# Patient Record
Sex: Female | Born: 1935 | Race: White | Hispanic: No | Marital: Married | Smoking: Never smoker
Health system: Southern US, Community
[De-identification: ages and names within clinical notes are randomized; demographics above are authoritative.]

## PROBLEM LIST (undated history)

## (undated) DIAGNOSIS — M26609 Unspecified temporomandibular joint disorder, unspecified side: Secondary | ICD-10-CM

## (undated) DIAGNOSIS — T7840XA Allergy, unspecified, initial encounter: Secondary | ICD-10-CM

## (undated) DIAGNOSIS — C50919 Malignant neoplasm of unspecified site of unspecified female breast: Secondary | ICD-10-CM

## (undated) DIAGNOSIS — I639 Cerebral infarction, unspecified: Secondary | ICD-10-CM

## (undated) DIAGNOSIS — I6522 Occlusion and stenosis of left carotid artery: Secondary | ICD-10-CM

## (undated) HISTORY — DX: Unspecified temporomandibular joint disorder, unspecified side: M26.609

## (undated) HISTORY — DX: Allergy, unspecified, initial encounter: T78.40XA

## (undated) HISTORY — DX: Occlusion and stenosis of left carotid artery: I65.22

## (undated) HISTORY — PX: ABDOMINAL HYSTERECTOMY: SHX81

## (undated) HISTORY — DX: Malignant neoplasm of unspecified site of unspecified female breast: C50.919

## (undated) HISTORY — DX: Cerebral infarction, unspecified: I63.9

---

## 1998-07-15 DIAGNOSIS — C50919 Malignant neoplasm of unspecified site of unspecified female breast: Secondary | ICD-10-CM

## 1998-07-15 HISTORY — DX: Malignant neoplasm of unspecified site of unspecified female breast: C50.919

## 2008-11-28 ENCOUNTER — Emergency Department: Payer: Self-pay | Admitting: Emergency Medicine

## 2009-03-08 ENCOUNTER — Other Ambulatory Visit: Payer: Self-pay | Admitting: Internal Medicine

## 2009-03-28 ENCOUNTER — Ambulatory Visit: Payer: Self-pay | Admitting: Internal Medicine

## 2009-05-15 ENCOUNTER — Other Ambulatory Visit: Payer: Self-pay | Admitting: Internal Medicine

## 2009-09-06 ENCOUNTER — Ambulatory Visit: Payer: Self-pay | Admitting: Surgery

## 2009-09-11 ENCOUNTER — Ambulatory Visit: Payer: Self-pay | Admitting: Surgery

## 2010-04-26 ENCOUNTER — Ambulatory Visit: Payer: Self-pay | Admitting: Ophthalmology

## 2011-03-19 ENCOUNTER — Encounter: Payer: Self-pay | Admitting: Internal Medicine

## 2011-03-19 ENCOUNTER — Other Ambulatory Visit: Payer: Self-pay | Admitting: *Deleted

## 2011-03-19 ENCOUNTER — Ambulatory Visit (INDEPENDENT_AMBULATORY_CARE_PROVIDER_SITE_OTHER): Payer: Medicare Other | Admitting: Internal Medicine

## 2011-03-19 DIAGNOSIS — I10 Essential (primary) hypertension: Secondary | ICD-10-CM

## 2011-03-19 DIAGNOSIS — R300 Dysuria: Secondary | ICD-10-CM

## 2011-03-19 DIAGNOSIS — R5383 Other fatigue: Secondary | ICD-10-CM

## 2011-03-19 DIAGNOSIS — Z1231 Encounter for screening mammogram for malignant neoplasm of breast: Secondary | ICD-10-CM

## 2011-03-19 DIAGNOSIS — Z0000 Encounter for general adult medical examination without abnormal findings: Secondary | ICD-10-CM

## 2011-03-19 DIAGNOSIS — R5381 Other malaise: Secondary | ICD-10-CM

## 2011-03-19 LAB — POCT URINALYSIS DIPSTICK
Bilirubin, UA: NEGATIVE
Glucose, UA: NEGATIVE
Ketones, UA: NEGATIVE
Protein, UA: 30
Spec Grav, UA: 1.015

## 2011-03-19 LAB — COMPREHENSIVE METABOLIC PANEL
AST: 26 U/L (ref 0–37)
Alkaline Phosphatase: 103 U/L (ref 39–117)
BUN: 13 mg/dL (ref 6–23)
Calcium: 9.3 mg/dL (ref 8.4–10.5)
Chloride: 105 mEq/L (ref 96–112)
Creatinine, Ser: 0.6 mg/dL (ref 0.4–1.2)
Glucose, Bld: 103 mg/dL — ABNORMAL HIGH (ref 70–99)

## 2011-03-19 LAB — CBC WITH DIFFERENTIAL/PLATELET
Basophils Relative: 0.7 % (ref 0.0–3.0)
Eosinophils Absolute: 0.1 10*3/uL (ref 0.0–0.7)
Eosinophils Relative: 1.6 % (ref 0.0–5.0)
Hemoglobin: 11.2 g/dL — ABNORMAL LOW (ref 12.0–15.0)
Lymphocytes Relative: 23.6 % (ref 12.0–46.0)
MCHC: 33 g/dL (ref 30.0–36.0)
Neutro Abs: 5.8 10*3/uL (ref 1.4–7.7)
Neutrophils Relative %: 65.8 % (ref 43.0–77.0)
RBC: 3.66 Mil/uL — ABNORMAL LOW (ref 3.87–5.11)
WBC: 8.8 10*3/uL (ref 4.5–10.5)

## 2011-03-19 LAB — FERRITIN: Ferritin: 55.2 ng/mL (ref 10.0–291.0)

## 2011-03-19 MED ORDER — SULFAMETHOXAZOLE-TMP DS 800-160 MG PO TABS: 1.0000 | ORAL_TABLET | Freq: Two times a day (BID) | ORAL | Status: AC

## 2011-03-19 MED ORDER — ZOSTER VACCINE LIVE 19400 UNT/0.65ML ~~LOC~~ SOLR
0.6500 mL | Freq: Once | SUBCUTANEOUS | Status: DC
Start: 2011-03-19 — End: 2011-06-12

## 2011-03-19 NOTE — Progress Notes (Signed)
Subjective:    Patient ID: Tracy [name], female    DOB: [birth year], 76 y.o.   MRN: [id number]  HPI  Tracy [name] is a 76 year old female who presents for followup. She reports recent fatigue. She denies any dyspnea, fever, weight loss, or other focal symptoms. She is concerned that she may have hypothyroidism. She also notes dysuria over the last one to 2 weeks. She reports urinary urgency, and frequency, however denies any hematuria, flank pain, fever.  Tracy [name] has also had a history of mildly elevated blood pressure. In the past has been well controlled with diet and exercise. She reports at home her typical numbers are 130s over 80s. She has not had any chest pain, palpitations, headache, or shortness of breath.     Outpatient Encounter Prescriptions as of 03/19/2011  Medication Sig Dispense Refill  . Calcium Carbonate (CALCIUM 500 PO) Take 1 capsule by mouth daily.        . Cholecalciferol (VITAMIN D3) 1000 UNITS CAPS Take 1 tablet by mouth daily.        . Coenzyme Q10 (COQ10) 100 MG CAPS Take 1 tablet by mouth daily.        . diazepam (VALIUM) 2 MG tablet Take 2 mg by mouth as needed.        . fish oil-omega-3 fatty acids 1000 MG capsule Take 2 g by mouth daily.        Marland Kitchen ibuprofen (ADVIL,MOTRIN) 200 MG tablet Take 800 mg by mouth every 6 (six) hours as needed.        . Lutein 20 MG CAPS Take 1 capsule by mouth daily.        . Magnesium 500 MG CAPS Take 1 capsule by mouth daily.        . niacin (SLO-NIACIN) 500 MG tablet Take 500 mg by mouth daily.        . Zinc 50 MG CAPS Take 1 capsule by mouth daily.        Marland Kitchen zoster vaccine live, PF, (ZOSTAVAX) 78295 UNT/0.65ML injection Inject 19,400 Units into the skin once.  1 vial  0    Review of Systems  Constitutional: Positive for fatigue. Negative for fever, chills, appetite change and unexpected weight change.  HENT: Negative for ear pain, congestion, sore throat, trouble swallowing, neck pain, voice change and sinus pressure.     Eyes: Negative for visual disturbance.  Respiratory: Negative for cough, shortness of breath, wheezing and stridor.   Cardiovascular: Positive for leg swelling. Negative for chest pain and palpitations.  Gastrointestinal: Negative for nausea, vomiting, abdominal pain, diarrhea, constipation, blood in stool, abdominal distention and anal bleeding.  Genitourinary: Positive for dysuria, urgency and frequency. Negative for hematuria and flank pain.  Musculoskeletal: Negative for myalgias, arthralgias and gait problem.  Skin: Negative for color change and rash.  Neurological: Negative for dizziness and headaches.  Hematological: Negative for adenopathy. Does not bruise/bleed easily.  Psychiatric/Behavioral: Negative for suicidal ideas, sleep disturbance and dysphoric mood. The patient is not nervous/anxious.    BP 157/82  Pulse 91  Temp(Src) 98.1 F (36.7 C) (Oral)  Resp 14  Ht 5\' 3"  (1.6 m)  Wt 153 lb 8 oz (69.627 kg)  BMI 27.19 kg/m2  SpO2 99%     Objective:   Physical Exam  Constitutional: She is oriented to person, place, and time. She appears well-developed and well-nourished. No distress.  HENT:  Head: Normocephalic and atraumatic.  Right Ear: External ear normal.  Left Ear: External ear normal.  Nose: Nose normal.  Mouth/Throat: Oropharynx is clear and moist. No oropharyngeal exudate.  Eyes: Conjunctivae are normal. Pupils are equal, round, and reactive to light. Right eye exhibits no discharge. Left eye exhibits no discharge. No scleral icterus.  Neck: Normal range of motion. Neck supple. No tracheal deviation present. No thyromegaly present.  Cardiovascular: Normal rate, regular rhythm, normal heart sounds and intact distal pulses.  Exam reveals no gallop and no friction rub.   No murmur heard. Pulmonary/Chest: Effort normal and breath sounds normal. No respiratory distress. She has no wheezes. She has no rales. She exhibits no tenderness.  Abdominal: Soft. Bowel sounds are  normal. She exhibits no distension and no mass. There is no tenderness. There is no rebound and no guarding.  Genitourinary: No breast swelling, tenderness, discharge or bleeding.  Musculoskeletal: Normal range of motion. She exhibits no edema and no tenderness.  Lymphadenopathy:    She has no cervical adenopathy.  Neurological: She is alert and oriented to person, place, and time. No cranial nerve deficit. She exhibits normal muscle tone. Coordination normal.  Skin: Skin is warm and dry. No rash noted. She is not diaphoretic. No erythema. No pallor.  Psychiatric: She has a normal mood and affect. Her behavior is normal. Judgment and thought content normal.          Assessment & Plan:  1. Fatigue - question if fatigue may be related to urinary checked infection. Will treat urinary tract infection to see if any improvement. Will check CBC, TSH, CMP with lab work today. She will return to clinic in one month.  2. Dysuria - urine dip today positive for leukocytes. Will treat with Bactrim double strength tablet 1 by mouth twice daily x7 days. Urine culture will be sent. She will call or return to clinic immediately if symptoms do not improve or if they worsen.  3. Hypertension - patient with elevated blood pressure which has been previously controlled with diet and exercise alone. Blood pressure noted to be elevated today. She will return to clinic in one month. In the interim until her next visit she will record her blood pressure one to 2 times per week. She will call if blood pressure consistently running greater than 140/90.  4. Health maintenance - patient is due for her yearly breast exam today. This was performed and was unremarkable. Screening mammogram will be set up. She is status post hysterectomy and does not need further Pap smears. Lab work is up to date. We discussed Zostavax today and prescription was called to her pharmacy.

## 2011-03-19 NOTE — Patient Instructions (Signed)
Record blood pressure twice per week. Return to clinic in 1 month.  Hypertension (High Blood Pressure) As your heart beats, it forces blood through your arteries. This force is your blood pressure. If the pressure is too high, it is called hypertension (HTN) or high blood pressure. HTN is dangerous because you may have it and not know it. High blood pressure may mean that your heart has to work harder to pump blood. Your arteries may be narrow or stiff. The extra work puts you at risk for heart disease, stroke, and other problems.  Blood pressure consists of two numbers, a higher number over a lower, 110/72, for example. It is stated as "110 over 72." The ideal is below 120 for the top number (systolic) and under 80 for the bottom (diastolic). Write down your blood pressure today. You should pay close attention to your blood pressure if you have certain conditions such as:  Heart failure.  Prior heart attack.   Diabetes   Chronic kidney disease.   Prior stroke.   Multiple risk factors for heart disease.   To see if you have HTN, your blood pressure should be measured while you are seated with your arm held at the level of the heart. It should be measured at least twice. A one-time elevated blood pressure reading (especially in the Emergency Department) does not mean that you need treatment. There may be conditions in which the blood pressure is different between your right and left arms. It is important to see your caregiver soon for a recheck. Most people have essential hypertension which means that there is not a specific cause. This type of high blood pressure may be lowered by changing lifestyle factors such as:  Stress.  Smoking.   Lack of exercise.   Excessive weight.  Drug/tobacco/alcohol use.   Eating less salt.   Most people do not have symptoms from high blood pressure until it has caused damage to the body. Effective treatment can often prevent, delay or reduce that  damage. TREATMENT Treatment for high blood pressure, when a cause has been identified, is directed at the cause. There are a large number of medications to treat HTN. These fall into several categories, and your caregiver will help you select the medicines that are best for you. Medications may have side effects. You should review side effects with your caregiver. If your blood pressure stays high after you have made lifestyle changes or started on medicines,   Your medication(s) may need to be changed.   Other problems may need to be addressed.   Be certain you understand your prescriptions, and know how and when to take your medicine.   Be sure to follow up with your caregiver within the time frame advised (usually within two weeks) to have your blood pressure rechecked and to review your medications.   If you are taking more than one medicine to lower your blood pressure, make sure you know how and at what times they should be taken. Taking two medicines at the same time can result in blood pressure that is too low.  SEEK IMMEDIATE MEDICAL CARE IF YOU DEVELOP:  A severe headache, blurred or changing vision, or confusion.   Unusual weakness or numbness, or a faint feeling.   Severe chest or abdominal pain, vomiting, or breathing problems.  MAKE SURE YOU:   Understand these instructions.   Will watch your condition.   Will get help right away if you are not doing well or get  worse.  Document Released: 07/01/2005 Document Re-Released: 12/19/2009 [facility] Patient Information 2011 [place], [place].

## 2011-03-21 LAB — URINE CULTURE
Colony Count: NO GROWTH
Organism ID, Bacteria: NO GROWTH

## 2011-03-25 ENCOUNTER — Other Ambulatory Visit: Payer: Self-pay | Admitting: Internal Medicine

## 2011-03-25 ENCOUNTER — Telehealth: Payer: Self-pay | Admitting: *Deleted

## 2011-03-25 DIAGNOSIS — L989 Disorder of the skin and subcutaneous tissue, unspecified: Secondary | ICD-10-CM

## 2011-03-25 NOTE — Telephone Encounter (Signed)
Patient is asking if you could refer her to a dermatologist for some places on her skin that she is concerned with. She only wants to see a female. Please advise.

## 2011-03-25 NOTE — Telephone Encounter (Signed)
Sure, I would recommend Dr. Willeen Niece or Lowella Bandy at [facility].

## 2011-03-25 NOTE — Telephone Encounter (Signed)
Patient notified. She will call to schedule her self an appt.

## 2011-03-27 ENCOUNTER — Other Ambulatory Visit: Payer: Self-pay | Admitting: Internal Medicine

## 2011-03-27 DIAGNOSIS — Z1239 Encounter for other screening for malignant neoplasm of breast: Secondary | ICD-10-CM

## 2011-03-27 DIAGNOSIS — C50919 Malignant neoplasm of unspecified site of unspecified female breast: Secondary | ICD-10-CM

## 2011-04-03 ENCOUNTER — Encounter: Payer: Self-pay | Admitting: *Deleted

## 2011-04-04 ENCOUNTER — Other Ambulatory Visit: Payer: Medicare Other

## 2011-04-04 ENCOUNTER — Other Ambulatory Visit: Payer: Self-pay

## 2011-04-04 DIAGNOSIS — R5381 Other malaise: Secondary | ICD-10-CM

## 2011-04-04 DIAGNOSIS — R5383 Other fatigue: Secondary | ICD-10-CM

## 2011-04-09 ENCOUNTER — Telehealth: Payer: Self-pay | Admitting: *Deleted

## 2011-04-09 NOTE — Telephone Encounter (Signed)
Message copied by Jobie Quaker on Tue Apr 09, 2011  9:26 AM ------      Message from: Ronna Polio A      Created: Tue Apr 09, 2011  7:59 AM      Regarding: Mammo       Mammogram was normal.

## 2011-04-09 NOTE — Telephone Encounter (Signed)
Patient notified

## 2011-04-10 ENCOUNTER — Telehealth: Payer: Self-pay | Admitting: Internal Medicine

## 2011-04-10 NOTE — Telephone Encounter (Signed)
Pt called to see what appointment you were calling her about

## 2011-04-12 NOTE — Telephone Encounter (Signed)
[facility] and patient has an appt. With Dr. Roseanne Reno on 04/22/11 at 11:45. Patient is aware of appointment.

## 2011-04-18 ENCOUNTER — Ambulatory Visit (INDEPENDENT_AMBULATORY_CARE_PROVIDER_SITE_OTHER): Payer: Medicare Other | Admitting: Internal Medicine

## 2011-04-18 ENCOUNTER — Encounter: Payer: Self-pay | Admitting: Internal Medicine

## 2011-04-18 DIAGNOSIS — J309 Allergic rhinitis, unspecified: Secondary | ICD-10-CM | POA: Insufficient documentation

## 2011-04-18 DIAGNOSIS — R030 Elevated blood-pressure reading, without diagnosis of hypertension: Secondary | ICD-10-CM

## 2011-04-18 NOTE — Progress Notes (Signed)
Subjective:    Patient ID: Tracy [name], female    DOB: 26-Jun-1935, 76 y.o.   MRN: [id number]  HPI 75YO female who presents to follow up elevated BP noted at last visit. She brings record of BP which have typically been between 100-130/60-70.  She denies any chest pain, diaphoresis, palpitations or other complaints.  She is concerned today about recent increased clear nasal drainage and some tenderness especially in her right nare.  She denies fever, chills, sinus pressure. She is not currently taking any allergy medicines.  Outpatient Encounter Prescriptions as of 04/18/2011  Medication Sig Dispense Refill  . Calcium Carbonate (CALCIUM 500 PO) Take 1 capsule by mouth daily.        . Cholecalciferol (VITAMIN D3) 1000 UNITS CAPS Take 1 tablet by mouth daily.        . Coenzyme Q10 (COQ10) 100 MG CAPS Take 1 tablet by mouth daily.        . diazepam (VALIUM) 2 MG tablet Take 2 mg by mouth as needed.        . fish oil-omega-3 fatty acids 1000 MG capsule Take 2 g by mouth daily.        Marland Kitchen ibuprofen (ADVIL,MOTRIN) 200 MG tablet Take 800 mg by mouth every 6 (six) hours as needed.        . Lutein 20 MG CAPS Take 1 capsule by mouth daily.        . Magnesium 500 MG CAPS Take 1 capsule by mouth daily.        . niacin (SLO-NIACIN) 500 MG tablet Take 500 mg by mouth daily.        . Zinc 50 MG CAPS Take 1 capsule by mouth daily.        Marland Kitchen zoster vaccine live, PF, (ZOSTAVAX) 40981 UNT/0.65ML injection Inject 19,400 Units into the skin once.  1 vial  0    Review of Systems  Constitutional: Negative for fever, chills, diaphoresis, appetite change, fatigue and unexpected weight change.  HENT: Positive for nosebleeds, congestion, rhinorrhea and postnasal drip. Negative for ear pain, sore throat, trouble swallowing, neck pain, voice change and sinus pressure.   Eyes: Negative for visual disturbance.  Respiratory: Negative for cough, shortness of breath, wheezing and stridor.   Cardiovascular: Negative for  chest pain, palpitations and leg swelling.  Gastrointestinal: Negative for nausea, vomiting, abdominal pain, diarrhea, constipation, blood in stool, abdominal distention and anal bleeding.  Genitourinary: Negative for dysuria and flank pain.  Musculoskeletal: Negative for myalgias, arthralgias and gait problem.  Skin: Negative for color change and rash.  Neurological: Negative for dizziness and headaches.  Hematological: Negative for adenopathy. Does not bruise/bleed easily.  Psychiatric/Behavioral: Negative for suicidal ideas, sleep disturbance and dysphoric mood. The patient is not nervous/anxious.    BP 102/78  Pulse 100  Temp 98.6 F (37 C)  Resp 16  Ht 5\' 3"  (1.6 m)  Wt 142 lb (64.411 kg)  BMI 25.15 kg/m2  SpO2 99%     Objective:   Physical Exam  Constitutional: She is oriented to person, place, and time. She appears well-developed and well-nourished. No distress.  HENT:  Head: Normocephalic and atraumatic.  Right Ear: External ear normal.  Left Ear: External ear normal.  Nose: Mucosal edema and rhinorrhea present. Right sinus exhibits no maxillary sinus tenderness and no frontal sinus tenderness. Left sinus exhibits no maxillary sinus tenderness and no frontal sinus tenderness.  Mouth/Throat: Oropharynx is clear and moist. No oropharyngeal exudate.  Eyes: Conjunctivae are normal. Pupils  are equal, round, and reactive to light. Right eye exhibits no discharge. Left eye exhibits no discharge. No scleral icterus.  Neck: Normal range of motion. Neck supple. No tracheal deviation present. No thyromegaly present.  Cardiovascular: Normal rate, regular rhythm, normal heart sounds and intact distal pulses.  Exam reveals no gallop and no friction rub.   No murmur heard. Pulmonary/Chest: Effort normal and breath sounds normal. No respiratory distress. She has no wheezes. She has no rales. She exhibits no tenderness.  Musculoskeletal: Normal range of motion. She exhibits no edema and no  tenderness.  Lymphadenopathy:    She has no cervical adenopathy.  Neurological: She is alert and oriented to person, place, and time. No cranial nerve deficit. She exhibits normal muscle tone. Coordination normal.  Skin: Skin is warm and dry. No rash noted. She is not diaphoretic. No erythema. No pallor.  Psychiatric: She has a normal mood and affect. Her behavior is normal. Judgment and thought content normal.          Assessment & Plan:  1. Elevated blood pressure without diagnosis of HTN - BP much improved today. Will continue to monitor. Pt will call if >140/90  2. Allergic rhinitis - Will start nasal steroid to see if any improvement. Will also use OTC Claritin or Zyrtec daily during Fall allergy season. Pt will follow up in 6 months or earlier as needed.

## 2011-04-18 NOTE — Patient Instructions (Signed)
Start nasal steroid. Start Claritin. Call if any problems.

## 2011-04-23 ENCOUNTER — Encounter: Payer: Self-pay | Admitting: Internal Medicine

## 2011-05-06 ENCOUNTER — Telehealth: Payer: Self-pay | Admitting: *Deleted

## 2011-05-06 ENCOUNTER — Other Ambulatory Visit (INDEPENDENT_AMBULATORY_CARE_PROVIDER_SITE_OTHER): Payer: Medicare Other | Admitting: *Deleted

## 2011-05-06 DIAGNOSIS — N390 Urinary tract infection, site not specified: Secondary | ICD-10-CM

## 2011-05-06 LAB — POCT URINALYSIS DIPSTICK
Glucose, UA: NEGATIVE
Ketones, UA: 80
Nitrite, UA: NEGATIVE

## 2011-05-06 NOTE — Telephone Encounter (Signed)
Patient came in to give urine specimen. UA results entered and urine sent for culture.

## 2011-05-09 LAB — URINE CULTURE

## 2011-05-13 ENCOUNTER — Other Ambulatory Visit: Payer: Self-pay | Admitting: *Deleted

## 2011-05-13 ENCOUNTER — Other Ambulatory Visit (INDEPENDENT_AMBULATORY_CARE_PROVIDER_SITE_OTHER): Payer: Medicare Other | Admitting: *Deleted

## 2011-05-13 DIAGNOSIS — N390 Urinary tract infection, site not specified: Secondary | ICD-10-CM

## 2011-05-13 LAB — POCT URINALYSIS DIPSTICK
Nitrite, UA: NEGATIVE
Protein, UA: 30
pH, UA: 6

## 2011-05-13 MED ORDER — CIPROFLOXACIN HCL 500 MG PO TABS: 500.0000 mg | ORAL_TABLET | Freq: Two times a day (BID) | ORAL | Status: DC

## 2011-05-20 ENCOUNTER — Telehealth: Payer: Self-pay | Admitting: *Deleted

## 2011-05-20 ENCOUNTER — Other Ambulatory Visit (INDEPENDENT_AMBULATORY_CARE_PROVIDER_SITE_OTHER): Payer: Medicare Other | Admitting: *Deleted

## 2011-05-20 DIAGNOSIS — N390 Urinary tract infection, site not specified: Secondary | ICD-10-CM

## 2011-05-20 LAB — POCT URINALYSIS DIPSTICK
Bilirubin, UA: NEGATIVE
Protein, UA: NEGATIVE
Spec Grav, UA: 1.015
pH, UA: 6.5

## 2011-05-20 MED ORDER — CIPROFLOXACIN HCL 250 MG PO TABS: 250.0000 mg | ORAL_TABLET | Freq: Two times a day (BID) | ORAL | Status: AC

## 2011-05-20 NOTE — Telephone Encounter (Signed)
Results ready, please advise.

## 2011-05-20 NOTE — Telephone Encounter (Signed)
Message copied by Vernie Murders on Mon May 20, 2011  2:17 PM ------      Message from: Ronna Polio A      Created: Mon May 20, 2011 11:41 AM       Please call in Cipro 250mg  by mouth twice daily x 7 days. Please send urine for culture.

## 2011-05-20 NOTE — Telephone Encounter (Signed)
Patient informed. 

## 2011-05-20 NOTE — Telephone Encounter (Signed)
Pt scheduled for lab visit today. She continues to c/o cloudy urine and worried that she still has uti. Scheduled for u/a and culture today.

## 2011-05-22 LAB — URINE CULTURE: Colony Count: NO GROWTH

## 2011-05-27 ENCOUNTER — Telehealth: Payer: Self-pay | Admitting: *Deleted

## 2011-05-27 NOTE — Telephone Encounter (Signed)
Agree with plan 

## 2011-05-27 NOTE — Telephone Encounter (Signed)
Spoke w/pt - she c/o continued cloudy urine and fatigue. I explained that she had no growth on culture. Pt completed cipro today. I advised her that abx would stay in her system for a while but I set her up for recheck lab Wed. She is to call with any urinary burning, frequency, LBP, fever etc... Pt agreed.

## 2011-05-29 ENCOUNTER — Other Ambulatory Visit: Payer: Self-pay | Admitting: *Deleted

## 2011-05-29 ENCOUNTER — Other Ambulatory Visit (INDEPENDENT_AMBULATORY_CARE_PROVIDER_SITE_OTHER): Payer: Medicare Other | Admitting: *Deleted

## 2011-05-29 DIAGNOSIS — N390 Urinary tract infection, site not specified: Secondary | ICD-10-CM

## 2011-05-29 LAB — POCT URINALYSIS DIPSTICK
Glucose, UA: NEGATIVE
Protein, UA: NEGATIVE
Spec Grav, UA: 1.01
Urobilinogen, UA: 0.2

## 2011-05-29 MED ORDER — SULFAMETHOXAZOLE-TRIMETHOPRIM 800-160 MG PO TABS: 1.0000 | ORAL_TABLET | Freq: Two times a day (BID) | ORAL | Status: AC

## 2011-05-31 LAB — URINE CULTURE

## 2011-06-12 ENCOUNTER — Encounter: Payer: Self-pay | Admitting: Internal Medicine

## 2011-06-12 ENCOUNTER — Ambulatory Visit (INDEPENDENT_AMBULATORY_CARE_PROVIDER_SITE_OTHER): Payer: Medicare Other | Admitting: Internal Medicine

## 2011-06-12 VITALS — BP 160/80 | HR 77 | Temp 98.1°F | Wt 137.0 lb

## 2011-06-12 DIAGNOSIS — R300 Dysuria: Secondary | ICD-10-CM

## 2011-06-12 DIAGNOSIS — H9209 Otalgia, unspecified ear: Secondary | ICD-10-CM

## 2011-06-12 DIAGNOSIS — Z0000 Encounter for general adult medical examination without abnormal findings: Secondary | ICD-10-CM

## 2011-06-12 LAB — POCT URINALYSIS DIPSTICK
Bilirubin, UA: NEGATIVE
Ketones, UA: NEGATIVE
Nitrite, UA: NEGATIVE
Protein, UA: NEGATIVE
pH, UA: 6.5

## 2011-06-12 MED ORDER — ZOSTER VACCINE LIVE 19400 UNT/0.65ML ~~LOC~~ SOLR: 0.6500 mL | Freq: Once | SUBCUTANEOUS | Status: DC

## 2011-06-12 MED ORDER — ESTROGENS, CONJUGATED 0.625 MG/GM VA CREA: TOPICAL_CREAM | VAGINAL | Status: DC

## 2011-06-12 MED ORDER — CIPROFLOXACIN HCL 500 MG PO TABS: 500.0000 mg | ORAL_TABLET | Freq: Two times a day (BID) | ORAL | Status: AC

## 2011-06-12 NOTE — Progress Notes (Signed)
Subjective:    Patient ID: Tracy [name], female    DOB: Jan 09, 1935, 76 y.o.   MRN: [id number]  HPI 76YO female presents for acute visit. Two primary concerns today. First, notes that urine is cloudy and rust colored. Had UA 11/14 which showed positive leukocytes and blood. Urine culture at that time was negative. She was treated empirically with Bactrim. She reports that her symptoms did not improve. She continues to have cloudy urine and some dysuria. She denies flank pain. She denies fever or chills. She has been increasing her fluid intake with no improvement in her symptoms.  She is also concerned today about bilateral ear pain. She notes a sense of pressure in both of her ears. She notes that her hearing is slightly decreased. She has had this problem in the past and he responded to Claritin. She also notes that she has followup with audiology next week. She denies fever or chills. She denies nasal congestion.  Outpatient Encounter Prescriptions as of 06/12/2011  Medication Sig Dispense Refill  . Calcium Carbonate (CALCIUM 500 PO) Take 1 capsule by mouth daily.        . Cholecalciferol (VITAMIN D3) 1000 UNITS CAPS Take 1 tablet by mouth daily.        . Coenzyme Q10 (COQ10) 100 MG CAPS Take 1 tablet by mouth daily.        . diazepam (VALIUM) 2 MG tablet Take 2 mg by mouth as needed.        . fish oil-omega-3 fatty acids 1000 MG capsule Take 2 g by mouth daily.        Marland Kitchen ibuprofen (ADVIL,MOTRIN) 200 MG tablet Take 800 mg by mouth every 6 (six) hours as needed.        . Lutein 20 MG CAPS Take 1 capsule by mouth daily.        . Magnesium 500 MG CAPS Take 1 capsule by mouth daily.        . niacin (SLO-NIACIN) 500 MG tablet Take 500 mg by mouth daily.        . Zinc 50 MG CAPS Take 1 capsule by mouth daily.        Marland Kitchen zoster vaccine live, PF, (ZOSTAVAX) 91478 UNT/0.65ML injection Inject 19,400 Units into the skin once.  1 vial  0    Review of Systems  Constitutional: Negative for fever,  chills, appetite change, fatigue and unexpected weight change.  HENT: Positive for hearing loss and ear pain. Negative for congestion, sore throat, trouble swallowing, neck pain, voice change, sinus pressure and ear discharge.   Eyes: Negative for visual disturbance.  Respiratory: Negative for cough, shortness of breath, wheezing and stridor.   Cardiovascular: Negative for chest pain, palpitations and leg swelling.  Gastrointestinal: Negative for nausea, vomiting, abdominal pain, diarrhea, constipation and abdominal distention.  Genitourinary: Positive for dysuria, hematuria and pelvic pain. Negative for flank pain.  Musculoskeletal: Negative for myalgias, arthralgias and gait problem.  Skin: Negative for color change and rash.  Neurological: Negative for dizziness and headaches.  Hematological: Negative for adenopathy. Does not bruise/bleed easily.  Psychiatric/Behavioral: Negative for suicidal ideas, sleep disturbance and dysphoric mood. The patient is not nervous/anxious.    BP 160/80  Pulse 77  Temp(Src) 98.1 F (36.7 C) (Oral)  Wt 137 lb (62.143 kg)  SpO2 99%     Objective:   Physical Exam  Constitutional: She is oriented to person, place, and time. She appears well-developed and well-nourished. No distress.  HENT:  Head: Normocephalic and  atraumatic.  Right Ear: External ear normal. Tympanic membrane is not injected and not erythematous. A middle ear effusion is present.  Left Ear: External ear normal. Tympanic membrane is not injected and not erythematous. A middle ear effusion is present.  Nose: Nose normal.  Mouth/Throat: Oropharynx is clear and moist. No oropharyngeal exudate.  Eyes: Conjunctivae are normal. Pupils are equal, round, and reactive to light. Right eye exhibits no discharge. Left eye exhibits no discharge. No scleral icterus.  Neck: Normal range of motion. Neck supple. No tracheal deviation present. No thyromegaly present.  Cardiovascular: Normal rate, regular  rhythm, normal heart sounds and intact distal pulses.  Exam reveals no gallop and no friction rub.   No murmur heard. Pulmonary/Chest: Effort normal and breath sounds normal. No respiratory distress. She has no wheezes. She has no rales. She exhibits no tenderness.  Abdominal: Soft. She exhibits no distension. There is tenderness (suprapubic). There is no CVA tenderness.  Musculoskeletal: Normal range of motion. She exhibits no edema and no tenderness.  Lymphadenopathy:    She has no cervical adenopathy.  Neurological: She is alert and oriented to person, place, and time. No cranial nerve deficit. She exhibits normal muscle tone. Coordination normal.  Skin: Skin is warm and dry. No rash noted. She is not diaphoretic. No erythema. No pallor.  Psychiatric: She has a normal mood and affect. Her behavior is normal. Judgment and thought content normal.          Assessment & Plan:  1. Dysuria - Last urine culture neg and symptoms not alleviated after Bactrim or Cipro. Repeat UA today shows pos blood and leuk. Will send urine for culture. Will start Cipro bid x 14 days. Will start topical estrogen cream to help with atrophic vaginitis to see if this helps decrease recurrence of UTIs. Follow up 2 weeks.  2. Ear pain - Pt with bilateral middle ear effusions.  Will again try using Claritin-D.  We discussed possible use of prednisone. No signs of infection.  She has follow up with audiology-ENT next week. May eventually need tympanostomy tubes if symptoms persistent.  3. Health maintenance - Rx for zostavax submitted today.

## 2011-06-12 NOTE — Patient Instructions (Signed)
Your urine appears to be infected. We will send urine for culture.    Start Premarin cream. Apply pea-size amount to external genitalia daily.  Start Cipro twice daily.  Follow up 2 weeks.

## 2011-06-13 ENCOUNTER — Telehealth: Payer: Self-pay | Admitting: *Deleted

## 2011-06-13 NOTE — Telephone Encounter (Signed)
It is a topical cream, so very little absorbed. If she is reluctant to use it, however, we can hold off and just try treating UTI for 2 weeks with Cipro to see if resolution.

## 2011-06-13 NOTE — Telephone Encounter (Signed)
Patient informed, She will hold off on cream and keep f/u apt

## 2011-06-13 NOTE — Telephone Encounter (Signed)
Pt left VM - she was given RX for vaginal cream and realized after picking up RX that it was a hormone cream. She is worried about talking hormones due to history of cancer. Please advise.

## 2011-06-14 LAB — URINE CULTURE: Colony Count: NO GROWTH

## 2011-07-01 ENCOUNTER — Ambulatory Visit (INDEPENDENT_AMBULATORY_CARE_PROVIDER_SITE_OTHER): Payer: Medicare Other | Admitting: Internal Medicine

## 2011-07-01 ENCOUNTER — Encounter: Payer: Self-pay | Admitting: Internal Medicine

## 2011-07-01 VITALS — BP 160/80 | HR 89 | Temp 97.7°F | Wt 136.0 lb

## 2011-07-01 DIAGNOSIS — K4090 Unilateral inguinal hernia, without obstruction or gangrene, not specified as recurrent: Secondary | ICD-10-CM

## 2011-07-01 DIAGNOSIS — R319 Hematuria, unspecified: Secondary | ICD-10-CM

## 2011-07-01 DIAGNOSIS — N390 Urinary tract infection, site not specified: Secondary | ICD-10-CM

## 2011-07-01 DIAGNOSIS — M549 Dorsalgia, unspecified: Secondary | ICD-10-CM

## 2011-07-01 LAB — COMPREHENSIVE METABOLIC PANEL
ALT: 14 U/L (ref 0–35)
AST: 22 U/L (ref 0–37)
Albumin: 3.5 g/dL (ref 3.5–5.2)
Alkaline Phosphatase: 82 U/L (ref 39–117)
Potassium: 4 mEq/L (ref 3.5–5.1)
Sodium: 140 mEq/L (ref 135–145)
Total Bilirubin: 0.4 mg/dL (ref 0.3–1.2)
Total Protein: 8.2 g/dL (ref 6.0–8.3)

## 2011-07-01 LAB — POCT URINALYSIS DIPSTICK
Ketones, UA: NEGATIVE
Protein, UA: NEGATIVE
Spec Grav, UA: 1.015
Urobilinogen, UA: 0.2
pH, UA: 7

## 2011-07-01 MED ORDER — MELOXICAM 15 MG PO TABS: 15.0000 mg | ORAL_TABLET | Freq: Every day | ORAL | Status: DC

## 2011-07-01 MED ORDER — CYCLOBENZAPRINE HCL 10 MG PO TABS: 10.0000 mg | ORAL_TABLET | Freq: Three times a day (TID) | ORAL | Status: AC | PRN

## 2011-07-01 MED ORDER — CIPROFLOXACIN HCL 500 MG PO TABS: 500.0000 mg | ORAL_TABLET | Freq: Two times a day (BID) | ORAL | Status: AC

## 2011-07-01 NOTE — Progress Notes (Signed)
Subjective:    Patient ID: Tracy [name], female    DOB: 05/20/1935, 76 y.o.   MRN: [id number]  HPI 76YO female with h/o recurrent UTI presents for follow up. Did not start Premarin cream because of concern about estrogen exposure and malignancy. Notes seeing some hematuria on one occasion. Denies flank pain, fever, chills, dysuria, frequency, urgency.  Notes that she recently traveled to [place] and had some increased pain in her hip and left upper back after this trip. She attributed this to arthritis pain. No specific injury noted. Took husband's meloxicam and hydrocodone with some improvement.  Pain is persistent in left upper back. Located from base of neck to left upper shoulder blade.  Denies weakness in her left arm or pain with movement of her arm. Reports symptoms are gradually improving.  Also concerned today about bulging area right lower abdomen/groin. Noticed this about 2-3 weeks ago. Notes it is more prominent when she is standing. The area is not painful. She denies any overlying skin changes. Denies fever, chills, diarrhea, constipation, nausea.  Outpatient Encounter Prescriptions as of 07/01/2011  Medication Sig Dispense Refill  . Calcium Carbonate (CALCIUM 500 PO) Take 1 capsule by mouth daily.        . Cholecalciferol (VITAMIN D3) 1000 UNITS CAPS Take 1 tablet by mouth daily.        . Coenzyme Q10 (COQ10) 100 MG CAPS Take 1 tablet by mouth daily.        Marland Kitchen conjugated estrogens (PREMARIN) vaginal cream Apply pea-size amount to external genitalia daily  42.5 g  12  . diazepam (VALIUM) 2 MG tablet Take 2 mg by mouth as needed.        . fish oil-omega-3 fatty acids 1000 MG capsule Take 2 g by mouth daily.        . Lutein 20 MG CAPS Take 1 capsule by mouth daily.        . Magnesium 500 MG CAPS Take 1 capsule by mouth daily.        . niacin (SLO-NIACIN) 500 MG tablet Take 500 mg by mouth daily.        . Zinc 50 MG CAPS Take 1 capsule by mouth daily.        Marland Kitchen DISCONTD:  zoster vaccine live, PF, (ZOSTAVAX) 11914 UNT/0.65ML injection Inject 19,400 Units into the skin once.  1 vial  0  . cyclobenzaprine (FLEXERIL) 10 MG tablet Take 1 tablet (10 mg total) by mouth every 8 (eight) hours as needed for muscle spasms.  30 tablet  1  . meloxicam (MOBIC) 15 MG tablet Take 1 tablet (15 mg total) by mouth daily.  30 tablet  2    Review of Systems  Constitutional: Negative for fever, chills, appetite change, fatigue and unexpected weight change.  HENT: Negative for ear pain, congestion, sore throat, trouble swallowing, neck pain, voice change and sinus pressure.   Eyes: Negative for visual disturbance.  Respiratory: Negative for cough, shortness of breath, wheezing and stridor.   Cardiovascular: Negative for chest pain, palpitations and leg swelling.  Gastrointestinal: Positive for abdominal pain and abdominal distention. Negative for nausea, vomiting, diarrhea, constipation, blood in stool and anal bleeding.  Genitourinary: Negative for dysuria, urgency, frequency, flank pain and difficulty urinating.  Musculoskeletal: Positive for myalgias (left upper back) and arthralgias. Negative for gait problem.  Skin: Negative for color change and rash.  Neurological: Negative for dizziness and headaches.  Hematological: Negative for adenopathy. Does not bruise/bleed easily.  Psychiatric/Behavioral: Negative for suicidal  ideas, sleep disturbance and dysphoric mood. The patient is not nervous/anxious.    BP 160/80  Pulse 89  Temp(Src) 97.7 F (36.5 C) (Oral)  Wt 136 lb (61.689 kg)  SpO2 98%     Objective:   Physical Exam  Constitutional: She is oriented to person, place, and time. She appears well-developed and well-nourished. No distress.  HENT:  Head: Normocephalic and atraumatic.  Right Ear: External ear normal.  Left Ear: External ear normal.  Nose: Nose normal.  Mouth/Throat: Oropharynx is clear and moist. No oropharyngeal exudate.  Eyes: Conjunctivae are normal.  Pupils are equal, round, and reactive to light. Right eye exhibits no discharge. Left eye exhibits no discharge. No scleral icterus.  Neck: Normal range of motion. Neck supple. No tracheal deviation present. No thyromegaly present.  Cardiovascular: Normal rate, regular rhythm, normal heart sounds and intact distal pulses.  Exam reveals no gallop and no friction rub.   No murmur heard. Pulmonary/Chest: Effort normal and breath sounds normal. No respiratory distress. She has no wheezes. She has no rales. She exhibits no tenderness.  Abdominal: Soft. Bowel sounds are normal. She exhibits no distension and no mass. There is no tenderness. There is no rebound, no guarding and no CVA tenderness. A hernia is present. Hernia confirmed positive in the right inguinal area.    Musculoskeletal: Normal range of motion. She exhibits no edema and no tenderness.  Lymphadenopathy:    She has no cervical adenopathy.  Neurological: She is alert and oriented to person, place, and time. No cranial nerve deficit. She exhibits normal muscle tone. Coordination normal.  Skin: Skin is warm and dry. No rash noted. She is not diaphoretic. No erythema. No pallor.  Psychiatric: She has a normal mood and affect. Her behavior is normal. Judgment and thought content normal.          Assessment & Plan:  1. Right inguinal hernia - Will set up with general surgery for evaluation.  2. Left upper back pain - Secondary to muscular spasm. Will continue meloxicam and add flexeril 10mg  po tid prn. Discussed that flexeril is sedating, so she will only use as needed. Also discussed that she cannot take ibuprofen when taking meloxicam.  3. Hematuria/UTI - h/o chronic UTIs. Pt prefers not to use premarin because of potential risk of malignancy with hormone exposure. UA today shows no blood, but pos leuk. Will treat with Cipro 500mg  po bid x 7 days. Will send urine for culture.

## 2011-07-01 NOTE — Progress Notes (Signed)
Addended by: Melody Comas L on: 07/01/2011 11:50 AM   Modules accepted: Orders

## 2011-07-24 ENCOUNTER — Ambulatory Visit: Payer: Self-pay | Admitting: Surgery

## 2011-07-30 ENCOUNTER — Ambulatory Visit: Payer: Self-pay | Admitting: Surgery

## 2011-08-01 ENCOUNTER — Ambulatory Visit: Payer: Medicare Other | Admitting: Internal Medicine

## 2011-08-05 ENCOUNTER — Telehealth: Payer: Self-pay | Admitting: *Deleted

## 2011-08-05 NOTE — Telephone Encounter (Signed)
It would be helpful to check a CBC, ferritin and B12 level prior to her appointment. Then, maybe move up appointment to next week.

## 2011-08-05 NOTE — Telephone Encounter (Signed)
Pt left VM - hgb after hernia surgery was 10.6. She has f/u 2/19 and wants to know if she should see MD or have labs sooner?

## 2011-08-07 NOTE — Telephone Encounter (Signed)
Patient informed, scheduled for labs tomorrow and OV next

## 2011-08-08 ENCOUNTER — Other Ambulatory Visit (INDEPENDENT_AMBULATORY_CARE_PROVIDER_SITE_OTHER): Payer: Medicare Other | Admitting: *Deleted

## 2011-08-08 DIAGNOSIS — D649 Anemia, unspecified: Secondary | ICD-10-CM

## 2011-08-08 DIAGNOSIS — D510 Vitamin B12 deficiency anemia due to intrinsic factor deficiency: Secondary | ICD-10-CM

## 2011-08-08 DIAGNOSIS — Z0000 Encounter for general adult medical examination without abnormal findings: Secondary | ICD-10-CM

## 2011-08-08 DIAGNOSIS — D509 Iron deficiency anemia, unspecified: Secondary | ICD-10-CM

## 2011-08-08 LAB — CBC WITH DIFFERENTIAL/PLATELET
Basophils Relative: 0.5 % (ref 0.0–3.0)
Eosinophils Absolute: 0.1 10*3/uL (ref 0.0–0.7)
HCT: 31.4 % — ABNORMAL LOW (ref 36.0–46.0)
Hemoglobin: 10.3 g/dL — ABNORMAL LOW (ref 12.0–15.0)
Lymphocytes Relative: 20.2 % (ref 12.0–46.0)
Lymphs Abs: 1.8 10*3/uL (ref 0.7–4.0)
MCHC: 32.9 g/dL (ref 30.0–36.0)
MCV: 90.7 fl (ref 78.0–100.0)
Monocytes Absolute: 0.7 10*3/uL (ref 0.1–1.0)
Neutro Abs: 6.3 10*3/uL (ref 1.4–7.7)
RBC: 3.46 Mil/uL — ABNORMAL LOW (ref 3.87–5.11)

## 2011-08-09 ENCOUNTER — Encounter: Payer: Self-pay | Admitting: Internal Medicine

## 2011-08-16 ENCOUNTER — Ambulatory Visit (INDEPENDENT_AMBULATORY_CARE_PROVIDER_SITE_OTHER): Payer: Medicare Other | Admitting: Internal Medicine

## 2011-08-16 ENCOUNTER — Encounter: Payer: Self-pay | Admitting: Internal Medicine

## 2011-08-16 VITALS — BP 112/70 | HR 122 | Temp 97.6°F | Ht 64.0 in | Wt 130.0 lb

## 2011-08-16 DIAGNOSIS — R350 Frequency of micturition: Secondary | ICD-10-CM

## 2011-08-16 DIAGNOSIS — D509 Iron deficiency anemia, unspecified: Secondary | ICD-10-CM

## 2011-08-16 DIAGNOSIS — IMO0001 Reserved for inherently not codable concepts without codable children: Secondary | ICD-10-CM | POA: Insufficient documentation

## 2011-08-16 LAB — CBC WITH DIFFERENTIAL/PLATELET
Basophils Absolute: 0 10*3/uL (ref 0.0–0.1)
Eosinophils Absolute: 0.1 10*3/uL (ref 0.0–0.7)
HCT: 30.7 % — ABNORMAL LOW (ref 36.0–46.0)
Lymphocytes Relative: 23.8 % (ref 12.0–46.0)
MCHC: 33.2 g/dL (ref 30.0–36.0)
MCV: 89.7 fl (ref 78.0–100.0)
Monocytes Relative: 9.9 % (ref 3.0–12.0)
Neutro Abs: 4.8 10*3/uL (ref 1.4–7.7)
Platelets: 571 10*3/uL — ABNORMAL HIGH (ref 150.0–400.0)
WBC: 7.5 10*3/uL (ref 4.5–10.5)

## 2011-08-16 LAB — POCT URINALYSIS DIPSTICK
Ketones, UA: NEGATIVE
Spec Grav, UA: 1.015

## 2011-08-16 NOTE — Progress Notes (Signed)
Addended by: Melody Comas L on: 08/16/2011 11:26 AM   Modules accepted: Orders

## 2011-08-16 NOTE — Assessment & Plan Note (Signed)
No other symptoms to suggest UTI.  Suspect related to atrophic vaginitis. Will check urinalysis today.  Pt does not want to use topical estrogen because of h/o breast CA.

## 2011-08-16 NOTE — Assessment & Plan Note (Signed)
Persistent, recent Hgb 10.3. No evidence of dietary iron deficiency. Most likely from GI blood loss.  Stool occult blood testing x 1 was neg. Will have her repeat x 2 at home. Will set up repeat colonoscopy.  If normal, pt may need additional testing such as tagged RBC scan, upper endoscopy, and/or capsule study. Will have her continue Ferrous sulfate 325mg  po with goal of TID.  Repeat CBC, ferritin, TIBC today. Follow up 1 month.

## 2011-08-16 NOTE — Progress Notes (Signed)
Subjective:    Patient ID: Tracy [name], female    DOB: [birth year], 77 y.o.   MRN: [id number]  HPI 77YO female with h/o hyperlipidemia and iron deficiency anemia presents for follow up.  She recently had hernia repair and preoperative labs showed worsening anemia with Hgb of 10.  She was referred back to address this. In 03/2011, she had Hgb of 11 and iron studies showed iron def anemia.  She had fecal occult blood testing which was negative. She is UTD on colonoscopy, 7-8years ago was normal.  She has been taking iron supplements 325mg  ferrous sulfate daily.  She denies any noted blood in her stool. Her stool is black when she takes iron supplementation.  She denies any abdominal pain, nausea, diarrhea, or other complaints. She had some fatigue over the holidays, but this has resolved.  She reports eating a healthy, well-balanced diet and is not vegetarian.    She does note some urinary urgency/frequency over last few months, however denies any fever,chills,flank pain, dysuria, hematuria.  She has not started using topical premarin because of concern about breast cancer risk.  Outpatient Encounter Prescriptions as of 08/16/2011  Medication Sig Dispense Refill  . Calcium Carbonate (CALCIUM 500 PO) Take 1 capsule by mouth daily.        . Cholecalciferol (VITAMIN D3) 1000 UNITS CAPS Take 1 tablet by mouth daily.        . Coenzyme Q10 (COQ10) 100 MG CAPS Take 1 tablet by mouth daily.        . cyclobenzaprine (FLEXERIL) 10 MG tablet Take 1 tablet (10 mg total) by mouth every 8 (eight) hours as needed for muscle spasms.  30 tablet  1  . diazepam (VALIUM) 2 MG tablet Take 2 mg by mouth as needed.        . ferrous sulfate 325 (65 FE) MG EC tablet Take 325 mg by mouth daily.      . fish oil-omega-3 fatty acids 1000 MG capsule Take 2 g by mouth daily.        . Lutein 20 MG CAPS Take 1 capsule by mouth daily.        . Magnesium 500 MG CAPS Take 1 capsule by mouth daily.        . meloxicam (MOBIC) 15 MG  tablet Take 15 mg by mouth daily as needed.      . niacin (SLO-NIACIN) 500 MG tablet Take 500 mg by mouth daily.        . Zinc 50 MG CAPS Take 1 capsule by mouth daily.        Marland Kitchen DISCONTD: meloxicam (MOBIC) 15 MG tablet Take 1 tablet (15 mg total) by mouth daily.  30 tablet  2  . conjugated estrogens (PREMARIN) vaginal cream Apply pea-size amount to external genitalia daily  42.5 g  12    Review of Systems  Constitutional: Negative for fever, chills, appetite change, fatigue and unexpected weight change.  HENT: Negative for ear pain, congestion, sore throat, trouble swallowing, neck pain, voice change and sinus pressure.   Eyes: Negative for visual disturbance.  Respiratory: Negative for cough, shortness of breath, wheezing and stridor.   Cardiovascular: Negative for chest pain, palpitations and leg swelling.  Gastrointestinal: Negative for nausea, vomiting, abdominal pain, diarrhea, constipation, blood in stool, abdominal distention and anal bleeding.  Genitourinary: Positive for urgency and frequency. Negative for dysuria and flank pain.  Musculoskeletal: Negative for myalgias, arthralgias and gait problem.  Skin: Negative for color change and rash.  Neurological: Negative for dizziness and headaches.  Hematological: Negative for adenopathy. Does not bruise/bleed easily.  Psychiatric/Behavioral: Negative for suicidal ideas, sleep disturbance and dysphoric mood. The patient is not nervous/anxious.    BP 112/70  Pulse 122  Temp(Src) 97.6 F (36.4 C) (Oral)  Ht 5\' 4"  (1.626 m)  Wt 130 lb (58.968 kg)  BMI 22.31 kg/m2  SpO2 100%     Objective:   Physical Exam  Constitutional: She is oriented to person, place, and time. She appears well-developed and well-nourished. No distress.  HENT:  Head: Normocephalic and atraumatic.  Right Ear: External ear normal.  Left Ear: External ear normal.  Nose: Nose normal.  Mouth/Throat: Oropharynx is clear and moist. No oropharyngeal exudate.    Eyes: Conjunctivae are normal. Pupils are equal, round, and reactive to light. Right eye exhibits no discharge. Left eye exhibits no discharge. No scleral icterus.  Neck: Normal range of motion. Neck supple. No tracheal deviation present. No thyromegaly present.  Cardiovascular: Normal rate, regular rhythm, normal heart sounds and intact distal pulses.  Exam reveals no gallop and no friction rub.   No murmur heard. Pulmonary/Chest: Effort normal and breath sounds normal. No respiratory distress. She has no wheezes. She has no rales. She exhibits no tenderness.  Abdominal: Soft. Bowel sounds are normal. She exhibits no distension and no mass. There is no tenderness. There is no rebound and no guarding.  Musculoskeletal: Normal range of motion. She exhibits no edema and no tenderness.  Lymphadenopathy:    She has no cervical adenopathy.  Neurological: She is alert and oriented to person, place, and time. No cranial nerve deficit. She exhibits normal muscle tone. Coordination normal.  Skin: Skin is warm and dry. No rash noted. She is not diaphoretic. No erythema. No pallor.  Psychiatric: She has a normal mood and affect. Her behavior is normal. Judgment and thought content normal.          Assessment & Plan:

## 2011-08-17 LAB — IRON AND TIBC
%SAT: 9 % — ABNORMAL LOW (ref 20–55)
TIBC: 271 ug/dL (ref 250–470)
UIBC: 247 ug/dL (ref 125–400)

## 2011-08-18 LAB — URINE CULTURE: Colony Count: 15000

## 2011-08-19 ENCOUNTER — Other Ambulatory Visit (INDEPENDENT_AMBULATORY_CARE_PROVIDER_SITE_OTHER): Payer: Medicare Other | Admitting: *Deleted

## 2011-08-19 ENCOUNTER — Encounter: Payer: Self-pay | Admitting: *Deleted

## 2011-08-19 ENCOUNTER — Encounter: Payer: Self-pay | Admitting: Internal Medicine

## 2011-08-19 ENCOUNTER — Telehealth: Payer: Self-pay | Admitting: *Deleted

## 2011-08-19 DIAGNOSIS — N390 Urinary tract infection, site not specified: Secondary | ICD-10-CM

## 2011-08-19 MED ORDER — FERROUS SULFATE 325 (65 FE) MG PO TBEC: 325.0000 mg | DELAYED_RELEASE_TABLET | Freq: Three times a day (TID) | ORAL | Status: DC

## 2011-08-19 NOTE — Telephone Encounter (Signed)
Rx sent in

## 2011-08-19 NOTE — Telephone Encounter (Signed)
Message copied by Vernie Murders on Mon Aug 19, 2011 10:30 AM ------      Message from: Ronna Polio A      Created: Fri Aug 16, 2011 11:31 AM       Please send urine for culture. Pt has had several recent episodes where urine pos for leuk and blood but culture neg. Has she seen urology in the past? Would she be willing to see them for evaluation.  May need cystoscopy to determine etiology of symptoms.

## 2011-08-19 NOTE — Telephone Encounter (Signed)
See other pt message °

## 2011-08-21 ENCOUNTER — Other Ambulatory Visit: Payer: Medicare Other

## 2011-08-21 DIAGNOSIS — Z1211 Encounter for screening for malignant neoplasm of colon: Secondary | ICD-10-CM

## 2011-08-21 LAB — URINE CULTURE
Colony Count: NO GROWTH
Organism ID, Bacteria: NO GROWTH

## 2011-09-03 ENCOUNTER — Encounter: Payer: Self-pay | Admitting: Internal Medicine

## 2011-09-03 ENCOUNTER — Ambulatory Visit (INDEPENDENT_AMBULATORY_CARE_PROVIDER_SITE_OTHER): Payer: Medicare Other | Admitting: Internal Medicine

## 2011-09-03 VITALS — BP 162/90 | HR 123 | Temp 97.7°F | Ht 64.0 in | Wt 128.0 lb

## 2011-09-03 DIAGNOSIS — R252 Cramp and spasm: Secondary | ICD-10-CM

## 2011-09-03 DIAGNOSIS — D509 Iron deficiency anemia, unspecified: Secondary | ICD-10-CM

## 2011-09-03 LAB — CBC WITH DIFFERENTIAL/PLATELET
Basophils Absolute: 0 10*3/uL (ref 0.0–0.1)
Eosinophils Absolute: 0 10*3/uL (ref 0.0–0.7)
MCHC: 32.5 g/dL (ref 30.0–36.0)
MCV: 88.4 fl (ref 78.0–100.0)
Monocytes Absolute: 0.9 10*3/uL (ref 0.1–1.0)
Neutrophils Relative %: 73.2 % (ref 43.0–77.0)
Platelets: 503 10*3/uL — ABNORMAL HIGH (ref 150.0–400.0)

## 2011-09-03 LAB — FERRITIN: Ferritin: 141.8 ng/mL (ref 10.0–291.0)

## 2011-09-03 MED ORDER — DIAZEPAM 2 MG PO TABS: 2.0000 mg | ORAL_TABLET | Freq: Three times a day (TID) | ORAL | Status: DC | PRN

## 2011-09-03 NOTE — Assessment & Plan Note (Signed)
Symptoms of fatigue are improved with use of iron supplementation. Will recheck CBC and ferritin level today. Followup in 3 months.

## 2011-09-03 NOTE — Progress Notes (Signed)
Subjective:    Patient ID: Tracy [name], female    DOB: June 25, 1935, 77 y.o.   MRN: [id number]  HPI 77 year old female with history of iron deficiency anemia presents for followup. She reports significant improvement in her fatigue and energy level since starting iron supplementation. She notes that she has been taking her ferrous sulfate 3 times per day. She has not experienced any constipation with this medication. She has noted a change in her stools to black color. She recently had stool occult testing for blood which was negative. She denies any new complaints today. She generally reports she is feeling well.  Outpatient Encounter Prescriptions as of 09/03/2011  Medication Sig Dispense Refill  . Calcium Carbonate (CALCIUM 500 PO) Take 1 capsule by mouth daily.        . Cholecalciferol (VITAMIN D3) 1000 UNITS CAPS Take 1 tablet by mouth daily.        . Coenzyme Q10 (COQ10) 100 MG CAPS Take 1 tablet by mouth daily.        Marland Kitchen conjugated estrogens (PREMARIN) vaginal cream Apply pea-size amount to external genitalia daily  42.5 g  12  . cyclobenzaprine (FLEXERIL) 10 MG tablet Take 1 tablet (10 mg total) by mouth every 8 (eight) hours as needed for muscle spasms.  30 tablet  1  . diazepam (VALIUM) 2 MG tablet Take 1 tablet (2 mg total) by mouth every 8 (eight) hours as needed.  90 tablet  3  . ferrous sulfate 325 (65 FE) MG EC tablet Take 1 tablet (325 mg total) by mouth 3 (three) times daily with meals.  90 tablet  1  . fish oil-omega-3 fatty acids 1000 MG capsule Take 2 g by mouth daily.        . Lutein 20 MG CAPS Take 1 capsule by mouth daily.        . Magnesium 500 MG CAPS Take 1 capsule by mouth daily.        . meloxicam (MOBIC) 15 MG tablet Take 15 mg by mouth daily as needed.      . niacin (SLO-NIACIN) 500 MG tablet Take 500 mg by mouth daily.        . Zinc 50 MG CAPS Take 1 capsule by mouth daily.        Marland Kitchen DISCONTD: diazepam (VALIUM) 2 MG tablet Take 2 mg by mouth as needed.           Review of Systems  Constitutional: Negative for fever, chills, appetite change, fatigue and unexpected weight change.  HENT: Negative for ear pain, congestion, sore throat, trouble swallowing, neck pain, voice change and sinus pressure.   Eyes: Negative for visual disturbance.  Respiratory: Negative for cough, shortness of breath, wheezing and stridor.   Cardiovascular: Negative for chest pain, palpitations and leg swelling.  Gastrointestinal: Negative for nausea, vomiting, abdominal pain, diarrhea, constipation, blood in stool, abdominal distention and anal bleeding.  Genitourinary: Negative for dysuria, urgency, frequency and flank pain.  Musculoskeletal: Negative for myalgias, arthralgias and gait problem.  Skin: Negative for color change and rash.  Neurological: Negative for dizziness and headaches.  Hematological: Negative for adenopathy. Does not bruise/bleed easily.  Psychiatric/Behavioral: Negative for suicidal ideas, sleep disturbance and dysphoric mood. The patient is not nervous/anxious.    BP 162/90  Pulse 123  Temp(Src) 97.7 F (36.5 C) (Oral)  Ht 5\' 4"  (1.626 m)  Wt 128 lb (58.06 kg)  BMI 21.97 kg/m2  SpO2 100%     Objective:   Physical Exam  Constitutional: She is oriented to person, place, and time. She appears well-developed and well-nourished. No distress.  HENT:  Head: Normocephalic and atraumatic.  Right Ear: External ear normal.  Left Ear: External ear normal.  Nose: Nose normal.  Mouth/Throat: Oropharynx is clear and moist. No oropharyngeal exudate.  Eyes: Conjunctivae are normal. Pupils are equal, round, and reactive to light. Right eye exhibits no discharge. Left eye exhibits no discharge. No scleral icterus.  Neck: Normal range of motion. Neck supple. No tracheal deviation present. No thyromegaly present.  Cardiovascular: Normal rate, regular rhythm, normal heart sounds and intact distal pulses.  Exam reveals no gallop and no friction rub.   No  murmur heard. Pulmonary/Chest: Effort normal and breath sounds normal. No respiratory distress. She has no wheezes. She has no rales. She exhibits no tenderness.  Musculoskeletal: Normal range of motion. She exhibits no edema and no tenderness.  Lymphadenopathy:    She has no cervical adenopathy.  Neurological: She is alert and oriented to person, place, and time. No cranial nerve deficit. She exhibits normal muscle tone. Coordination normal.  Skin: Skin is warm and dry. No rash noted. She is not diaphoretic. No erythema. No pallor.  Psychiatric: She has a normal mood and affect. Her behavior is normal. Judgment and thought content normal.          Assessment & Plan:

## 2011-09-04 ENCOUNTER — Encounter: Payer: Self-pay | Admitting: Internal Medicine

## 2011-09-17 ENCOUNTER — Encounter: Payer: Self-pay | Admitting: Internal Medicine

## 2011-10-02 ENCOUNTER — Encounter: Payer: Self-pay | Admitting: Internal Medicine

## 2011-10-02 ENCOUNTER — Telehealth: Payer: Self-pay | Admitting: Internal Medicine

## 2011-10-02 ENCOUNTER — Other Ambulatory Visit: Payer: Self-pay | Admitting: *Deleted

## 2011-10-02 MED ORDER — ALPRAZOLAM 0.5 MG PO TBDP: 0.5000 mg | ORAL_TABLET | Freq: Three times a day (TID) | ORAL | Status: DC | PRN

## 2011-10-02 MED ORDER — ALPRAZOLAM 0.25 MG PO TABS: 0.2500 mg | ORAL_TABLET | Freq: Three times a day (TID) | ORAL | Status: AC | PRN

## 2011-10-02 NOTE — Telephone Encounter (Signed)
I spoke w/pt and scheduled her for apt Friday at 1. She is desperate for med to help her sleep and wants to know if you would call something in tonight?

## 2011-10-02 NOTE — Telephone Encounter (Signed)
Rx called in, patient notified.

## 2011-10-02 NOTE — Telephone Encounter (Signed)
Can we please work her in sooner?

## 2011-10-02 NOTE — Telephone Encounter (Signed)
We can call alprazolam 0.25mg  po tid prn anxiety, disp 60.

## 2011-10-02 NOTE — Telephone Encounter (Signed)
Caller: Amiaya/Patient; PCP: Ronna Polio; CB#: [phone]; ; ; Call regarding Lost Her Husband 09/26/11; Seen by GI 10/01/11; has endoscopy scheduled 10/21/11, but was told she seems very tired, and suggested she call the office to see if Dr. Dan Humphreys would consider prescribing something to help her sleep.  Patient has not been sleeping well since her husband died.  States she has some mild anxiety as well during the day.  This is new for her, and she states this is associated with "all the things I have to do to prepare for the funeral and changes to  bank accounts etc."  Declines appt unless necessary for getting rx.  Patient states she will be going to the hospital for pre-procedure work 10/21/11 and needs to change her appt with Dr. Dan Humphreys scheduled that day; caller transferred to front desk to reschedule.   Info to ofifce for provider review/Rx/callback.   USES [facility] AT [phone] home or CELL [phone].

## 2011-10-02 NOTE — Telephone Encounter (Signed)
Alprazolam 0.25  Mg tablet called in. Deleted the alprazolam 0.5 that was entered incorrectly.

## 2011-10-02 NOTE — Telephone Encounter (Signed)
I did explain to the patient that we may be able to give her a couple pills to get to apt. I explained that these type of meds require an OV to discuss but pt was very upset. See other phone note.

## 2011-10-04 ENCOUNTER — Ambulatory Visit: Payer: Medicare Other | Admitting: Internal Medicine

## 2011-10-21 ENCOUNTER — Ambulatory Visit: Payer: Medicare Other | Admitting: Internal Medicine

## 2011-10-22 ENCOUNTER — Ambulatory Visit: Payer: Self-pay | Admitting: Unknown Physician Specialty

## 2011-11-05 ENCOUNTER — Ambulatory Visit (INDEPENDENT_AMBULATORY_CARE_PROVIDER_SITE_OTHER): Payer: Medicare Other | Admitting: Internal Medicine

## 2011-11-05 ENCOUNTER — Encounter: Payer: Self-pay | Admitting: Internal Medicine

## 2011-11-05 VITALS — BP 143/81 | HR 94 | Ht 64.0 in | Wt 119.0 lb

## 2011-11-05 DIAGNOSIS — R51 Headache: Secondary | ICD-10-CM

## 2011-11-05 DIAGNOSIS — D509 Iron deficiency anemia, unspecified: Secondary | ICD-10-CM

## 2011-11-05 DIAGNOSIS — R519 Headache, unspecified: Secondary | ICD-10-CM | POA: Insufficient documentation

## 2011-11-05 DIAGNOSIS — F411 Generalized anxiety disorder: Secondary | ICD-10-CM

## 2011-11-05 DIAGNOSIS — F419 Anxiety disorder, unspecified: Secondary | ICD-10-CM

## 2011-11-05 DIAGNOSIS — E785 Hyperlipidemia, unspecified: Secondary | ICD-10-CM

## 2011-11-05 LAB — COMPREHENSIVE METABOLIC PANEL
ALT: 8 U/L (ref 0–35)
AST: 15 U/L (ref 0–37)
Albumin: 3.2 g/dL — ABNORMAL LOW (ref 3.5–5.2)
Calcium: 8.7 mg/dL (ref 8.4–10.5)
Chloride: 104 mEq/L (ref 96–112)
Potassium: 3.2 mEq/L — ABNORMAL LOW (ref 3.5–5.1)
Total Protein: 8 g/dL (ref 6.0–8.3)

## 2011-11-05 LAB — CBC WITH DIFFERENTIAL/PLATELET
Basophils Absolute: 0 10*3/uL (ref 0.0–0.1)
Eosinophils Absolute: 0.1 10*3/uL (ref 0.0–0.7)
Lymphocytes Relative: 23.1 % (ref 12.0–46.0)
MCHC: 32.5 g/dL (ref 30.0–36.0)
Neutro Abs: 4.7 10*3/uL (ref 1.4–7.7)
Neutrophils Relative %: 64.5 % (ref 43.0–77.0)
RDW: 16.7 % — ABNORMAL HIGH (ref 11.5–14.6)

## 2011-11-05 LAB — LIPID PANEL
LDL Cholesterol: 58 mg/dL (ref 0–99)
Total CHOL/HDL Ratio: 3

## 2011-11-05 MED ORDER — ALPRAZOLAM 0.25 MG PO TABS: 0.2500 mg | ORAL_TABLET | Freq: Three times a day (TID) | ORAL | Status: DC | PRN

## 2011-11-05 MED ORDER — HYDROCODONE-ACETAMINOPHEN 5-325 MG PO TABS: 1.0000 | ORAL_TABLET | Freq: Three times a day (TID) | ORAL | Status: DC | PRN

## 2011-11-05 MED ORDER — FERROUS SULFATE 325 (65 FE) MG PO TBEC: 325.0000 mg | DELAYED_RELEASE_TABLET | Freq: Three times a day (TID) | ORAL | Status: DC

## 2011-11-05 NOTE — Assessment & Plan Note (Signed)
Patient reports strong family history of heart disease. Will check lipids and LFTs with labs today. She prefers aggressive lipid control and we will start a statin medication with goal of getting LDL less than 100, as long as LFTs normal.

## 2011-11-05 NOTE — Assessment & Plan Note (Signed)
Patient with anxiety related to the recent death of husband. Offered support today. She is not interested in counseling at this point. We'll continue Xanax as needed.

## 2011-11-05 NOTE — Assessment & Plan Note (Signed)
Intermittent tension headaches. Using hydrocodone on rare occasion with improvement. Will continue.

## 2011-11-05 NOTE — Progress Notes (Signed)
Subjective:    Patient ID: Tracy [name], female    DOB: 1935-06-05, 77 y.o.   MRN: [id number]  HPI 77 year old female with a history of iron deficiency anemia and recent anxiety related to the unexpected death of her husband presents for followup. In regards to her iron deficiency anemia, she reports compliance with her ferrous sulfate, however notes she has been taking this medication with milk. She recently had upper and lower endoscopy which were unrevealing as to cause of her anemia. She reports that her dietary intake of iron has been limited especially recently as her appetite declined after the death of her husband.  In regards to the death of her husband, she reports initially she had significant anxiety and depressed mood. However, she now feels that she is coping well. She continues to use Xanax occasionally to help with anxiety. She gets good relief with this medication.  Outpatient Encounter Prescriptions as of 11/05/2011  Medication Sig Dispense Refill  . ALPRAZolam (XANAX) 0.25 MG tablet Take 1 tablet (0.25 mg total) by mouth 3 (three) times daily as needed for sleep.  90 tablet  3  . Cholecalciferol (VITAMIN D3) 1000 UNITS CAPS Take 1 tablet by mouth daily.        . cyclobenzaprine (FLEXERIL) 10 MG tablet Take 1 tablet (10 mg total) by mouth every 8 (eight) hours as needed for muscle spasms.  30 tablet  1  . diazepam (VALIUM) 2 MG tablet Take 1 tablet (2 mg total) by mouth every 8 (eight) hours as needed.  90 tablet  3  . fish oil-omega-3 fatty acids 1000 MG capsule Take 2 g by mouth daily.        Marland Kitchen HYDROcodone-acetaminophen (NORCO) 5-325 MG per tablet Take 1 tablet by mouth every 8 (eight) hours as needed for pain.  90 tablet  3  . meloxicam (MOBIC) 15 MG tablet Take 15 mg by mouth daily as needed.      . Calcium Carbonate (CALCIUM 500 PO) Take 1 capsule by mouth daily.        Marland Kitchen conjugated estrogens (PREMARIN) vaginal cream Apply pea-size amount to external genitalia daily  42.5  g  12  . ferrous sulfate 325 (65 FE) MG EC tablet Take 1 tablet (325 mg total) by mouth 3 (three) times daily with meals.  90 tablet  1    Review of Systems  Constitutional: Negative for fever, chills, appetite change, fatigue and unexpected weight change.  HENT: Negative for ear pain, congestion, sore throat, trouble swallowing, neck pain, voice change and sinus pressure.   Eyes: Negative for visual disturbance.  Respiratory: Negative for cough, shortness of breath, wheezing and stridor.   Cardiovascular: Negative for chest pain, palpitations and leg swelling.  Gastrointestinal: Negative for nausea, vomiting, abdominal pain, diarrhea, constipation, blood in stool, abdominal distention and anal bleeding.  Genitourinary: Negative for dysuria and flank pain.  Musculoskeletal: Negative for myalgias, arthralgias and gait problem.  Skin: Negative for color change and rash.  Neurological: Positive for headaches. Negative for dizziness.  Hematological: Negative for adenopathy. Does not bruise/bleed easily.  Psychiatric/Behavioral: Positive for dysphoric mood. Negative for suicidal ideas and sleep disturbance. The patient is nervous/anxious.    BP 143/81  Pulse 94  Ht 5\' 4"  (1.626 m)  Wt 119 lb (53.978 kg)  BMI 20.43 kg/m2     Objective:   Physical Exam  Constitutional: She is oriented to person, place, and time. She appears well-developed and well-nourished. No distress.  HENT:  Head: Normocephalic  and atraumatic.  Right Ear: External ear normal.  Left Ear: External ear normal.  Nose: Nose normal.  Mouth/Throat: Oropharynx is clear and moist. No oropharyngeal exudate.  Eyes: Conjunctivae are normal. Pupils are equal, round, and reactive to light. Right eye exhibits no discharge. Left eye exhibits no discharge. No scleral icterus.  Neck: Normal range of motion. Neck supple. No tracheal deviation present. No thyromegaly present.  Cardiovascular: Normal rate, regular rhythm, normal heart  sounds and intact distal pulses.  Exam reveals no gallop and no friction rub.   No murmur heard. Pulmonary/Chest: Effort normal and breath sounds normal. No respiratory distress. She has no wheezes. She has no rales. She exhibits no tenderness.  Musculoskeletal: Normal range of motion. She exhibits no edema and no tenderness.  Lymphadenopathy:    She has no cervical adenopathy.  Neurological: She is alert and oriented to person, place, and time. No cranial nerve deficit. She exhibits normal muscle tone. Coordination normal.  Skin: Skin is warm and dry. No rash noted. She is not diaphoretic. No erythema. No pallor.  Psychiatric: Her speech is normal and behavior is normal. Judgment and thought content normal. Her mood appears anxious. Cognition and memory are normal.          Assessment & Plan:

## 2011-11-05 NOTE — Assessment & Plan Note (Signed)
Symptoms of fatigue are persistent. Patient has not been taking ferrous sulfate correctly. Will continue oral ferrous sulfate and we discussed proper way to take medication with either water or orange juice. Will repeat CBC and ferritin today. If persistently low, would favor hematology referral for IV iron. Recent GI evaluation including upper and lower endoscopy was unrevealing as to source of blood loss. Suspect iron deficiency may be diet related.

## 2011-11-06 ENCOUNTER — Encounter: Payer: Self-pay | Admitting: Internal Medicine

## 2011-11-08 ENCOUNTER — Telehealth: Payer: Self-pay | Admitting: Internal Medicine

## 2011-11-08 NOTE — Telephone Encounter (Signed)
[phone]    Cell [phone] Pt called to get lab results she had labs done this week

## 2011-11-08 NOTE — Telephone Encounter (Signed)
I called patient back, she says that she did not realize her labs had been sent to her through my chart. Patient was given results and she says that she does not want to start statin med at this time.

## 2011-12-02 ENCOUNTER — Ambulatory Visit (INDEPENDENT_AMBULATORY_CARE_PROVIDER_SITE_OTHER): Payer: Medicare Other | Admitting: Internal Medicine

## 2011-12-02 DIAGNOSIS — N390 Urinary tract infection, site not specified: Secondary | ICD-10-CM

## 2011-12-02 LAB — POCT URINALYSIS DIPSTICK
Glucose, UA: NEGATIVE
Ketones, UA: NEGATIVE
Nitrite, UA: POSITIVE
Protein, UA: 100
Urobilinogen, UA: 0.2

## 2011-12-02 MED ORDER — CIPROFLOXACIN HCL 500 MG PO TABS: 500.0000 mg | ORAL_TABLET | Freq: Two times a day (BID) | ORAL | Status: DC

## 2011-12-02 NOTE — Progress Notes (Signed)
  Subjective:    Patient ID: Tracy [name], female    DOB: 1934/09/23, 77 y.o.   MRN: [id number]  HPI Nurse visit    Review of Systems     Objective:   Physical Exam        Assessment & Plan:

## 2011-12-05 LAB — URINE CULTURE

## 2011-12-10 ENCOUNTER — Telehealth: Payer: Self-pay | Admitting: *Deleted

## 2011-12-10 ENCOUNTER — Other Ambulatory Visit (INDEPENDENT_AMBULATORY_CARE_PROVIDER_SITE_OTHER): Payer: Medicare Other | Admitting: *Deleted

## 2011-12-10 DIAGNOSIS — R5383 Other fatigue: Secondary | ICD-10-CM

## 2011-12-10 DIAGNOSIS — Z0000 Encounter for general adult medical examination without abnormal findings: Secondary | ICD-10-CM

## 2011-12-10 DIAGNOSIS — R5381 Other malaise: Secondary | ICD-10-CM

## 2011-12-10 DIAGNOSIS — E785 Hyperlipidemia, unspecified: Secondary | ICD-10-CM

## 2011-12-10 DIAGNOSIS — D649 Anemia, unspecified: Secondary | ICD-10-CM

## 2011-12-10 LAB — CBC WITH DIFFERENTIAL/PLATELET
Eosinophils Absolute: 0.1 10*3/uL (ref 0.0–0.7)
HCT: 30.3 % — ABNORMAL LOW (ref 36.0–46.0)
Lymphs Abs: 1.7 10*3/uL (ref 0.7–4.0)
MCHC: 32.4 g/dL (ref 30.0–36.0)
MCV: 90.7 fl (ref 78.0–100.0)
Monocytes Absolute: 0.6 10*3/uL (ref 0.1–1.0)
Neutrophils Relative %: 73.5 % (ref 43.0–77.0)
Platelets: 517 10*3/uL — ABNORMAL HIGH (ref 150.0–400.0)
RDW: 16.5 % — ABNORMAL HIGH (ref 11.5–14.6)

## 2011-12-10 LAB — COMPREHENSIVE METABOLIC PANEL
ALT: 4 U/L (ref 0–35)
Alkaline Phosphatase: 63 U/L (ref 39–117)
Creatinine, Ser: 0.4 mg/dL (ref 0.4–1.2)
GFR: 170.55 mL/min (ref >60.00)
Glucose, Bld: 83 mg/dL (ref 70–99)
Sodium: 139 mEq/L (ref 135–145)
Total Bilirubin: 0.4 mg/dL (ref 0.3–1.2)
Total Protein: 7.7 g/dL (ref 6.0–8.3)

## 2011-12-10 LAB — LIPID PANEL
Cholesterol: 124 mg/dL (ref 0–200)
HDL: 50.3 mg/dL (ref >39.00)
LDL Cholesterol: 60 mg/dL (ref 0–99)
VLDL: 13.6 mg/dL (ref 0.0–40.0)

## 2011-12-10 LAB — FERRITIN: Ferritin: 117.3 ng/mL (ref 10.0–291.0)

## 2011-12-10 NOTE — Telephone Encounter (Signed)
Patient came in for labs today, she has an appt scheduled for next week for a one month follow up, but she wanted to have labs done prior to the appt. What would you like for me to order ?

## 2011-12-10 NOTE — Telephone Encounter (Signed)
CBC, ferritin, CMP, lipid, tsh

## 2011-12-16 ENCOUNTER — Ambulatory Visit (INDEPENDENT_AMBULATORY_CARE_PROVIDER_SITE_OTHER): Payer: Medicare Other | Admitting: Internal Medicine

## 2011-12-16 ENCOUNTER — Encounter: Payer: Self-pay | Admitting: Internal Medicine

## 2011-12-16 VITALS — BP 122/70 | HR 56 | Temp 97.8°F | Ht 64.0 in | Wt 115.2 lb

## 2011-12-16 DIAGNOSIS — D509 Iron deficiency anemia, unspecified: Secondary | ICD-10-CM

## 2011-12-16 DIAGNOSIS — R51 Headache: Secondary | ICD-10-CM

## 2011-12-16 DIAGNOSIS — M899 Disorder of bone, unspecified: Secondary | ICD-10-CM

## 2011-12-16 DIAGNOSIS — R64 Cachexia: Secondary | ICD-10-CM

## 2011-12-16 DIAGNOSIS — F329 Major depressive disorder, single episode, unspecified: Secondary | ICD-10-CM

## 2011-12-16 DIAGNOSIS — R300 Dysuria: Secondary | ICD-10-CM

## 2011-12-16 DIAGNOSIS — M898X8 Other specified disorders of bone, other site: Secondary | ICD-10-CM

## 2011-12-16 LAB — POCT URINALYSIS DIPSTICK
Blood, UA: NEGATIVE
Glucose, UA: NEGATIVE
Nitrite, UA: NEGATIVE
Protein, UA: NEGATIVE
Urobilinogen, UA: 0.2
pH, UA: 7

## 2011-12-16 MED ORDER — PREDNISONE 20 MG PO TABS: 20.0000 mg | ORAL_TABLET | Freq: Every day | ORAL | Status: AC

## 2011-12-16 MED ORDER — MIRTAZAPINE 15 MG PO TABS: 15.0000 mg | ORAL_TABLET | Freq: Every day | ORAL | Status: DC

## 2011-12-16 NOTE — Assessment & Plan Note (Signed)
Patient has lost nearly 40 pounds in September of last year. Initially, this was intentional with efforts at diet and exercise. However, with the death of her husband, and exacerbation and depression and anxiety she had decreased appetite and has had progressive weight loss. Encouraged her to increase her consumption of nutritional supplements such as boost to at least twice per day. Underlying malignancy is also a consideration. Evaluation is in process for possible skull mass. We'll also set up referral to hematology for evaluation of persistent iron deficiency and possible IV iron therapy.

## 2011-12-16 NOTE — Assessment & Plan Note (Signed)
Patient was concerned about blood in the urine. Repeat urinalysis today is normal. Will continue to monitor.

## 2011-12-16 NOTE — Progress Notes (Signed)
Subjective:    Patient ID: Tracy [name], female    DOB: 12/21/1934, 77 y.o.   MRN: [id number]  HPI 77 year old female with history of iron deficiency anemia presents for followup. Recent labs showed a decline in her hemoglobin to less than 10. She reports that she is no longer taking oral iron therapy because of intolerance of this medication. GI evaluation including upper and lower endoscopy was unremarkable. She reports some worsening fatigue.  She is also concerned today about abnormality of her left skull. She reports noting a bony prominence on the left just above her temple over the last few weeks. She reports that this area is painful and sometimes tender. She has also had some diffuse headaches. However, these headaches are consistent with her chronic headaches. She reports that in the past a course of prednisone has been helpful to stop her headache cycle. She denies any visual changes. She denies any lightheadedness. She has had significant weight loss since the death of her husband. She reports that her appetite continues to be poor. She has tried supplementing her diet with nutritional drinks. She typically has a nutritional supplement with whey protein approximately once per day.  She is also concerned about history of blood in her urine. She denies any frank blood in her urine, however microscopic hematuria was noted with recent urinary tract infection. She occasionally still has some symptoms of dysuria. She denies fever, chills, flank pain.  Outpatient Encounter Prescriptions as of 12/16/2011  Medication Sig Dispense Refill  . ALPRAZolam (XANAX) 0.25 MG tablet Take 1 tablet (0.25 mg total) by mouth 3 (three) times daily as needed for sleep.  90 tablet  3  . Calcium Carbonate (CALCIUM 500 PO) Take 1 capsule by mouth daily.        . Cholecalciferol (VITAMIN D3) 1000 UNITS CAPS Take 1 tablet by mouth daily.        Marland Kitchen conjugated estrogens (PREMARIN) vaginal cream Apply pea-size amount to  external genitalia daily  42.5 g  12  . cyclobenzaprine (FLEXERIL) 10 MG tablet Take 1 tablet (10 mg total) by mouth every 8 (eight) hours as needed for muscle spasms.  30 tablet  1  . diazepam (VALIUM) 2 MG tablet Take 1 tablet (2 mg total) by mouth every 8 (eight) hours as needed.  90 tablet  3  . ferrous sulfate 325 (65 FE) MG EC tablet Take 1 tablet (325 mg total) by mouth 3 (three) times daily with meals.  90 tablet  1  . fish oil-omega-3 fatty acids 1000 MG capsule Take 2 g by mouth daily.        Marland Kitchen HYDROcodone-acetaminophen (NORCO) 5-325 MG per tablet Take 1 tablet by mouth every 8 (eight) hours as needed for pain.  90 tablet  3  . meloxicam (MOBIC) 15 MG tablet Take 15 mg by mouth daily as needed.      . mirtazapine (REMERON) 15 MG tablet Take 1 tablet (15 mg total) by mouth at bedtime.  30 tablet  3  . predniSONE (DELTASONE) 20 MG tablet Take 1 tablet (20 mg total) by mouth daily.  5 tablet  0    Review of Systems  Constitutional: Positive for fatigue and unexpected weight change. Negative for fever, chills and appetite change.  HENT: Negative for ear pain, congestion, sore throat, trouble swallowing, neck pain, voice change and sinus pressure.   Eyes: Negative for visual disturbance.  Respiratory: Negative for cough, shortness of breath, wheezing and stridor.   Cardiovascular: Negative  for chest pain, palpitations and leg swelling.  Gastrointestinal: Negative for nausea, vomiting, abdominal pain, diarrhea, constipation, blood in stool, abdominal distention and anal bleeding.  Genitourinary: Positive for dysuria. Negative for frequency, hematuria, flank pain and pelvic pain.  Musculoskeletal: Negative for myalgias, arthralgias and gait problem.  Skin: Negative for color change and rash.  Neurological: Negative for dizziness and headaches.  Hematological: Negative for adenopathy. Does not bruise/bleed easily.  Psychiatric/Behavioral: Positive for dysphoric mood. Negative for suicidal  ideas and sleep disturbance. The patient is nervous/anxious.    BP 122/70  Pulse 56  Temp(Src) 97.8 F (36.6 C) (Oral)  Ht 5\' 4"  (1.626 m)  Wt 115 lb 4 oz (52.277 kg)  BMI 19.78 kg/m2  SpO2 91%     Objective:   Physical Exam  Constitutional: She is oriented to person, place, and time. She appears well-developed and well-nourished. No distress.  HENT:  Head: Normocephalic and atraumatic.    Right Ear: Tympanic membrane, external ear and ear canal normal.  Left Ear: Tympanic membrane, external ear and ear canal normal.  Nose: Nose normal.  Mouth/Throat: Oropharynx is clear and moist. No oropharyngeal exudate or posterior oropharyngeal erythema.  Eyes: Conjunctivae are normal. Pupils are equal, round, and reactive to light. Right eye exhibits no discharge. Left eye exhibits no discharge. No scleral icterus.  Neck: Normal range of motion. Neck supple. No tracheal deviation present. No thyromegaly present.  Cardiovascular: Normal rate, regular rhythm, normal heart sounds and intact distal pulses.  Exam reveals no gallop and no friction rub.   No murmur heard. Pulmonary/Chest: Effort normal and breath sounds normal. No respiratory distress. She has no wheezes. She has no rales. She exhibits no tenderness.  Abdominal: Soft. Bowel sounds are normal. She exhibits no distension and no mass. There is no tenderness. There is no rebound and no guarding.  Musculoskeletal: Normal range of motion. She exhibits no edema and no tenderness.  Lymphadenopathy:    She has no cervical adenopathy.  Neurological: She is alert and oriented to person, place, and time. No cranial nerve deficit. She exhibits normal muscle tone. Coordination normal.  Skin: Skin is warm and dry. No rash noted. She is not diaphoretic. No erythema. No pallor.  Psychiatric: Her speech is normal and behavior is normal. Judgment and thought content normal. Cognition and memory are normal. She exhibits a depressed mood.            Assessment & Plan:

## 2011-12-16 NOTE — Assessment & Plan Note (Signed)
Chronic, however recently worsening. Will treat with course of prednisone as this has helped in the past. As below, CT head ordered because of bony prominence in the left forehead.

## 2011-12-16 NOTE — Assessment & Plan Note (Signed)
Symptoms consistent with depression, exacerbated by recent death of husband. Will start Remeron at night. Discussed counseling however patient refuses. Followup one month.

## 2011-12-16 NOTE — Assessment & Plan Note (Signed)
Pt concerned about asymmetry over left skull. Minimal bony prominence appreciated. Suspect related to temporal wasting, however will get CT head for further evaluation. Will also set up ENT evaluation.

## 2011-12-16 NOTE — Assessment & Plan Note (Signed)
Symptoms are persistent and patient did not tolerate oral iron therapy. Will set up hematology evaluation for consideration of IV iron.

## 2011-12-17 ENCOUNTER — Ambulatory Visit: Payer: Self-pay | Admitting: Internal Medicine

## 2011-12-19 ENCOUNTER — Telehealth: Payer: Self-pay | Admitting: Internal Medicine

## 2011-12-19 NOTE — Telephone Encounter (Signed)
CT brain normal

## 2011-12-23 ENCOUNTER — Encounter: Payer: Self-pay | Admitting: Internal Medicine

## 2011-12-24 ENCOUNTER — Encounter: Payer: Self-pay | Admitting: Internal Medicine

## 2011-12-26 ENCOUNTER — Other Ambulatory Visit: Payer: Self-pay | Admitting: Internal Medicine

## 2011-12-26 DIAGNOSIS — R791 Abnormal coagulation profile: Secondary | ICD-10-CM

## 2012-01-03 ENCOUNTER — Ambulatory Visit: Payer: Self-pay | Admitting: Internal Medicine

## 2012-01-06 ENCOUNTER — Ambulatory Visit (INDEPENDENT_AMBULATORY_CARE_PROVIDER_SITE_OTHER): Payer: Medicare Other | Admitting: Internal Medicine

## 2012-01-06 ENCOUNTER — Encounter: Payer: Self-pay | Admitting: Internal Medicine

## 2012-01-06 VITALS — BP 120/80 | HR 75 | Temp 98.0°F | Ht 64.0 in | Wt 118.5 lb

## 2012-01-06 DIAGNOSIS — M899 Disorder of bone, unspecified: Secondary | ICD-10-CM

## 2012-01-06 DIAGNOSIS — F329 Major depressive disorder, single episode, unspecified: Secondary | ICD-10-CM

## 2012-01-06 DIAGNOSIS — D509 Iron deficiency anemia, unspecified: Secondary | ICD-10-CM

## 2012-01-06 DIAGNOSIS — M898X8 Other specified disorders of bone, other site: Secondary | ICD-10-CM

## 2012-01-06 DIAGNOSIS — R64 Cachexia: Secondary | ICD-10-CM

## 2012-01-06 NOTE — Assessment & Plan Note (Signed)
Symptomatically improved with use of Remeron. Encouraged her to take this medication daily. Followup in 3 months.

## 2012-01-06 NOTE — Progress Notes (Signed)
Subjective:    Patient ID: Tracy [name], female    DOB: [birth year], 77 y.o.   MRN: [id number]  HPI 77 year old female with history of depression, weight loss, and iron deficiency anemia presents for followup. At her last visit, she was started on Remeron to help with depression and insomnia. She reports that she does not take his medication every night, but she has noted some improvement in her depression and sleep. Her weight is up 3 pounds. She notes improved appetite. She denies any noted side effects from the medication.  In regards to iron deficiency anemia, she reports she was recently seen by hematology and hemoglobin was 10. She was started on a new oral iron supplement with plans to repeat lab work in 3 weeks.  At her last visit she was also concerned about bony protuberance over her left forehead. She reports that this has improved. She had CT of the head which was normal. She was also seen by ENT who noted no abnormality and decided to hold off on biopsy at this time.  Outpatient Encounter Prescriptions as of 01/06/2012  Medication Sig Dispense Refill  . ALPRAZolam (XANAX) 0.25 MG tablet Take 1 tablet (0.25 mg total) by mouth 3 (three) times daily as needed for sleep.  90 tablet  3  . cyclobenzaprine (FLEXERIL) 10 MG tablet Take 1 tablet (10 mg total) by mouth every 8 (eight) hours as needed for muscle spasms.  30 tablet  1  . diazepam (VALIUM) 2 MG tablet Take 1 tablet (2 mg total) by mouth every 8 (eight) hours as needed.  90 tablet  3  . ferrous sulfate 325 (65 FE) MG EC tablet Take 1 tablet (325 mg total) by mouth 3 (three) times daily with meals.  90 tablet  1  . fish oil-omega-3 fatty acids 1000 MG capsule Take 2 g by mouth daily.        Marland Kitchen HYDROcodone-acetaminophen (NORCO) 5-325 MG per tablet Take 1 tablet by mouth every 8 (eight) hours as needed for pain.  90 tablet  3  . meloxicam (MOBIC) 15 MG tablet Take 15 mg by mouth daily as needed.      . mirtazapine (REMERON) 15 MG  tablet Take 1 tablet (15 mg total) by mouth at bedtime.  30 tablet  3  . Calcium Carbonate (CALCIUM 500 PO) Take 1 capsule by mouth daily.        . Cholecalciferol (VITAMIN D3) 1000 UNITS CAPS Take 1 tablet by mouth daily.        Marland Kitchen DISCONTD: conjugated estrogens (PREMARIN) vaginal cream Apply pea-size amount to external genitalia daily  42.5 g  12    Review of Systems  Constitutional: Negative for fever, chills, appetite change, fatigue and unexpected weight change.  HENT: Negative for ear pain, congestion, sore throat, trouble swallowing, neck pain, voice change and sinus pressure.   Eyes: Negative for visual disturbance.  Respiratory: Negative for cough, shortness of breath, wheezing and stridor.   Cardiovascular: Negative for chest pain, palpitations and leg swelling.  Gastrointestinal: Negative for nausea, vomiting, abdominal pain, diarrhea, constipation, blood in stool, abdominal distention and anal bleeding.  Genitourinary: Negative for dysuria and flank pain.  Musculoskeletal: Negative for myalgias, arthralgias and gait problem.  Skin: Negative for color change and rash.  Neurological: Negative for dizziness and headaches.  Hematological: Negative for adenopathy. Does not bruise/bleed easily.  Psychiatric/Behavioral: Negative for suicidal ideas, disturbed wake/sleep cycle and dysphoric mood. The patient is not nervous/anxious.    BP 120/80  Pulse 75  Temp 98 F (36.7 C) (Oral)  Ht 5\' 4"  (1.626 m)  Wt 118 lb 8 oz (53.751 kg)  BMI 20.34 kg/m2  SpO2 98%     Objective:   Physical Exam  Constitutional: She is oriented to person, place, and time. She appears well-developed and well-nourished. No distress.  HENT:  Head: Normocephalic and atraumatic.  Right Ear: External ear normal.  Left Ear: External ear normal.  Nose: Nose normal.  Mouth/Throat: Oropharynx is clear and moist. No oropharyngeal exudate.  Eyes: Conjunctivae are normal. Pupils are equal, round, and reactive to  light. Right eye exhibits no discharge. Left eye exhibits no discharge. No scleral icterus.  Neck: Normal range of motion. Neck supple. No tracheal deviation present. No thyromegaly present.  Cardiovascular: Normal rate, regular rhythm, normal heart sounds and intact distal pulses.  Exam reveals no gallop and no friction rub.   No murmur heard. Pulmonary/Chest: Effort normal and breath sounds normal. No respiratory distress. She has no wheezes. She has no rales. She exhibits no tenderness.  Musculoskeletal: Normal range of motion. She exhibits no edema and no tenderness.  Lymphadenopathy:    She has no cervical adenopathy.  Neurological: She is alert and oriented to person, place, and time. No cranial nerve deficit. She exhibits normal muscle tone. Coordination normal.  Skin: Skin is warm and dry. No rash noted. She is not diaphoretic. No erythema. No pallor.  Psychiatric: She has a normal mood and affect. Her behavior is normal. Judgment and thought content normal.          Assessment & Plan:

## 2012-01-06 NOTE — Assessment & Plan Note (Signed)
Per patient, recent hemoglobin was 10 as performed by hematologist. Will get records on this. She has plans to have a CBC repeated in 3 weeks.

## 2012-01-06 NOTE — Assessment & Plan Note (Signed)
Exam is improved. CT showed no abnormality. ENT evaluation was unremarkable. We'll continue to monitor. Question if this may have been an area of inflammation on the superficial scalp which has now resolved.

## 2012-01-06 NOTE — Assessment & Plan Note (Signed)
Weight has improved with use of Remeron. We'll plan to recheck weight in 6 weeks.

## 2012-01-13 ENCOUNTER — Ambulatory Visit: Payer: Self-pay | Admitting: Internal Medicine

## 2012-02-12 ENCOUNTER — Ambulatory Visit (INDEPENDENT_AMBULATORY_CARE_PROVIDER_SITE_OTHER): Payer: Medicare Other | Admitting: Internal Medicine

## 2012-02-12 ENCOUNTER — Encounter: Payer: Self-pay | Admitting: Internal Medicine

## 2012-02-12 VITALS — BP 140/86 | HR 92 | Temp 98.7°F | Ht 64.0 in | Wt 123.0 lb

## 2012-02-12 DIAGNOSIS — R64 Cachexia: Secondary | ICD-10-CM

## 2012-02-12 DIAGNOSIS — F329 Major depressive disorder, single episode, unspecified: Secondary | ICD-10-CM

## 2012-02-12 DIAGNOSIS — D509 Iron deficiency anemia, unspecified: Secondary | ICD-10-CM

## 2012-02-12 MED ORDER — MIRTAZAPINE 30 MG PO TABS: 30.0000 mg | ORAL_TABLET | Freq: Every day | ORAL | Status: DC

## 2012-02-12 NOTE — Assessment & Plan Note (Signed)
Will get recent notes from hematology. Plan to continue iron supplement and follow up 3 months with repeat CBC, ferritin.

## 2012-02-12 NOTE — Progress Notes (Signed)
Subjective:    Patient ID: Tracy [name], female    DOB: [birth year], 77 y.o.   MRN: [id number]  HPI 77 year old female with history of depression, iron deficiency anemia, and cachexia presents for followup. In regards to her depression, she reports that symptoms are improved somewhat with use of Remeron, however not optimal. She continues to have increased depressed mood and tearfulness particularly when thinking about her husband. She reports that she falls asleep well but often wakes startled to find him missing. She has not yet sought counseling.  In regards to iron deficiency anemia, she reports she was recently seen by her hematologist and had lab work performed. She is not sure the results of this lab work. She continues to take iron supplements. She denies any fatigue.  In regards to cachexia, her weight is up 4 pounds. She reports appetite is improved.  Outpatient Encounter Prescriptions as of 02/12/2012  Medication Sig Dispense Refill  . ALPRAZolam (XANAX) 0.25 MG tablet Take 1 tablet (0.25 mg total) by mouth 3 (three) times daily as needed for sleep.  90 tablet  3  . cyclobenzaprine (FLEXERIL) 10 MG tablet Take 1 tablet (10 mg total) by mouth every 8 (eight) hours as needed for muscle spasms.  30 tablet  1  . diazepam (VALIUM) 2 MG tablet Take 1 tablet (2 mg total) by mouth every 8 (eight) hours as needed.  90 tablet  3  . ferrous fumarate-iron polysaccharide complex (TANDEM) 162-115.2 MG CAPS Take 1 capsule by mouth daily with breakfast.      . HYDROcodone-acetaminophen (NORCO) 5-325 MG per tablet Take 1 tablet by mouth every 8 (eight) hours as needed for pain.  90 tablet  3  . meloxicam (MOBIC) 15 MG tablet Take 15 mg by mouth daily as needed.      . mirtazapine (REMERON) 30 MG tablet Take 1 tablet (30 mg total) by mouth at bedtime.  30 tablet  6  . DISCONTD: mirtazapine (REMERON) 15 MG tablet Take 1 tablet (15 mg total) by mouth at bedtime.  30 tablet  3  . DISCONTD: mirtazapine  (REMERON) 15 MG tablet Take 15 mg by mouth at bedtime.      . Calcium Carbonate (CALCIUM 500 PO) Take 1 capsule by mouth daily.        . Cholecalciferol (VITAMIN D3) 1000 UNITS CAPS Take 1 tablet by mouth daily.        . ferrous sulfate 325 (65 FE) MG EC tablet Take 1 tablet (325 mg total) by mouth 3 (three) times daily with meals.  90 tablet  1  . fish oil-omega-3 fatty acids 1000 MG capsule Take 2 g by mouth daily.          Review of Systems  Constitutional: Negative for fever, chills, appetite change, fatigue and unexpected weight change.  HENT: Negative for ear pain, congestion, sore throat, trouble swallowing, neck pain, voice change and sinus pressure.   Eyes: Negative for visual disturbance.  Respiratory: Negative for cough, shortness of breath, wheezing and stridor.   Cardiovascular: Negative for chest pain, palpitations and leg swelling.  Gastrointestinal: Negative for nausea, vomiting, abdominal pain, diarrhea, constipation, blood in stool, abdominal distention and anal bleeding.  Genitourinary: Negative for dysuria and flank pain.  Musculoskeletal: Negative for myalgias, arthralgias and gait problem.  Skin: Negative for color change and rash.  Neurological: Negative for dizziness and headaches.  Hematological: Negative for adenopathy. Does not bruise/bleed easily.  Psychiatric/Behavioral: Positive for disturbed wake/sleep cycle and dysphoric mood. Negative  for suicidal ideas. The patient is not nervous/anxious.        Objective:   Physical Exam  Constitutional: She is oriented to person, place, and time. She appears well-developed and well-nourished. No distress.  HENT:  Head: Normocephalic and atraumatic.  Right Ear: External ear normal.  Left Ear: External ear normal.  Nose: Nose normal.  Mouth/Throat: Oropharynx is clear and moist. No oropharyngeal exudate.  Eyes: Conjunctivae are normal. Pupils are equal, round, and reactive to light. Right eye exhibits no discharge.  Left eye exhibits no discharge. No scleral icterus.  Neck: Normal range of motion. Neck supple. No tracheal deviation present. No thyromegaly present.  Cardiovascular: Normal rate, regular rhythm, normal heart sounds and intact distal pulses.  Exam reveals no gallop and no friction rub.   No murmur heard. Pulmonary/Chest: Effort normal and breath sounds normal. No respiratory distress. She has no wheezes. She has no rales. She exhibits no tenderness.  Musculoskeletal: Normal range of motion. She exhibits no edema and no tenderness.  Lymphadenopathy:    She has no cervical adenopathy.  Neurological: She is alert and oriented to person, place, and time. No cranial nerve deficit. She exhibits normal muscle tone. Coordination normal.  Skin: Skin is warm and dry. No rash noted. She is not diaphoretic. No erythema. No pallor.  Psychiatric: Her behavior is normal. Judgment and thought content normal. She exhibits a depressed mood.          Assessment & Plan:

## 2012-02-12 NOTE — Assessment & Plan Note (Signed)
Weight improved by 4 lbs. Will continue to monitor.

## 2012-02-12 NOTE — Assessment & Plan Note (Signed)
Symptoms improved but persistent. Will try increasing Remeron to 30mg  daily. Follow up 3 months and prn.

## 2012-02-13 ENCOUNTER — Ambulatory Visit: Payer: Self-pay | Admitting: Internal Medicine

## 2012-02-17 ENCOUNTER — Other Ambulatory Visit: Payer: Self-pay | Admitting: Ophthalmology

## 2012-02-21 ENCOUNTER — Encounter: Payer: Self-pay | Admitting: Internal Medicine

## 2012-05-18 ENCOUNTER — Ambulatory Visit (INDEPENDENT_AMBULATORY_CARE_PROVIDER_SITE_OTHER): Payer: Medicare Other | Admitting: Internal Medicine

## 2012-05-18 ENCOUNTER — Encounter: Payer: Self-pay | Admitting: Internal Medicine

## 2012-05-18 VITALS — BP 140/100 | HR 86 | Temp 98.3°F | Ht 64.0 in | Wt 147.5 lb

## 2012-05-18 DIAGNOSIS — F419 Anxiety disorder, unspecified: Secondary | ICD-10-CM

## 2012-05-18 DIAGNOSIS — R030 Elevated blood-pressure reading, without diagnosis of hypertension: Secondary | ICD-10-CM

## 2012-05-18 DIAGNOSIS — F411 Generalized anxiety disorder: Secondary | ICD-10-CM

## 2012-05-18 DIAGNOSIS — I10 Essential (primary) hypertension: Secondary | ICD-10-CM | POA: Insufficient documentation

## 2012-05-18 DIAGNOSIS — M316 Other giant cell arteritis: Secondary | ICD-10-CM | POA: Insufficient documentation

## 2012-05-18 MED ORDER — ALPRAZOLAM 0.25 MG PO TABS: 0.5000 mg | ORAL_TABLET | Freq: Three times a day (TID) | ORAL | Status: DC | PRN

## 2012-05-18 NOTE — Assessment & Plan Note (Signed)
Anxiety improved with use of Xanax. We'll continue. Followup in 3 months or sooner as needed.

## 2012-05-18 NOTE — Assessment & Plan Note (Signed)
Currently followed by rheumatology, on prednisone taper. Will obtain records from her rheumatologist including recent lab work with sedimentation rate. Followup here 3 months or sooner as needed.

## 2012-05-18 NOTE — Patient Instructions (Signed)
Please check blood pressure 1-2 times per week and email with readings [email or web address]

## 2012-05-18 NOTE — Assessment & Plan Note (Signed)
Blood pressure elevated today. Patient has a no history of hypertension, however is on high-dose steroids for temporal arteritis. She will monitor blood pressure at home and e-mail with readings. If blood pressure remains elevated greater than 140/90 would prefer starting amlodipine.

## 2012-05-18 NOTE — Progress Notes (Signed)
Subjective:    Patient ID: Tracy [name], female    DOB: 10-02-1934, 77 y.o.   MRN: [id number]  HPI 77 year old female with history of temporal arteritis and anxiety presents for followup. In the interim since her last visit, she went to see her ophthalmologist because of loss of vision in her left eye. She reports extensive workup was performed as there was a concern of stroke, and she underwent imaging of her brain which was normal. Ultimately, she had lab work including sedimentation rate performed which showed markedly elevated ESR. Temporal artery biopsy was performed which confirmed temporal arteritis. Notably, approximately 2 years ago, she had an episode of fatigue and left-sided temporal headache. At that time, sedimentation rate was elevated at 90. She was referred for temporal artery biopsy which was read as normal. At that time, she completed approximately one month prednisone taper. Symptoms did not recur in terms of visual changes until approximately one month ago. However, in the interim she did have some intermittent left-sided headache. She is now followed by another rheumatologist and is gradually tapering down on her prednisone. She has had some weight gain on this medication. She reports that blood pressure has been well controlled at home, typically 130s over 70s.  She also recently had worsening anxiety symptoms in the setting of unexpected death of her husband. She has been using alprazolam up to 3 times daily as needed with significant improvement in symptoms of anxiety and depression. She denies side effects of this medication.  Outpatient Encounter Prescriptions as of 05/18/2012  Medication Sig Dispense Refill  . alendronate (FOSAMAX) 70 MG tablet Take 70 mg by mouth every 7 (seven) days. Take with a full glass of water on an empty stomach.      . ALPRAZolam (XANAX) 0.25 MG tablet Take 2 tablets (0.5 mg total) by mouth 3 (three) times daily as needed for sleep or anxiety.  120  tablet  3  . Calcium Carbonate (CALCIUM 500 PO) Take 1 capsule by mouth daily.        . Cholecalciferol (VITAMIN D3) 1000 UNITS CAPS Take 1 tablet by mouth daily.        . cyclobenzaprine (FLEXERIL) 10 MG tablet Take 1 tablet (10 mg total) by mouth every 8 (eight) hours as needed for muscle spasms.  30 tablet  1  . diazepam (VALIUM) 2 MG tablet Take 1 tablet (2 mg total) by mouth every 8 (eight) hours as needed.  90 tablet  3  . ferrous fumarate-iron polysaccharide complex (TANDEM) 162-115.2 MG CAPS Take 1 capsule by mouth daily with breakfast.      . ferrous sulfate 325 (65 FE) MG EC tablet Take 1 tablet (325 mg total) by mouth 3 (three) times daily with meals.  90 tablet  1  . fish oil-omega-3 fatty acids 1000 MG capsule Take 2 g by mouth daily.        Marland Kitchen HYDROcodone-acetaminophen (NORCO) 5-325 MG per tablet Take 1 tablet by mouth every 8 (eight) hours as needed for pain.  90 tablet  3  . meloxicam (MOBIC) 15 MG tablet Take 15 mg by mouth daily as needed.      . mirtazapine (REMERON) 30 MG tablet Take 1 tablet (30 mg total) by mouth at bedtime.  30 tablet  6  . predniSONE (DELTASONE) 20 MG tablet Take 20 mg by mouth daily.      . [DISCONTINUED] ALPRAZolam (XANAX) 0.25 MG tablet Take 1 tablet (0.25 mg total) by mouth 3 (three) times  daily as needed for sleep.  90 tablet  3    BP 140/100  Pulse 86  Temp 98.3 F (36.8 C) (Oral)  Ht 5\' 4"  (1.626 m)  Wt 147 lb 8 oz (66.906 kg)  BMI 25.32 kg/m2  SpO2 97%  Review of Systems  Constitutional: Negative for fever, chills, appetite change, fatigue and unexpected weight change.  HENT: Negative for ear pain, congestion, sore throat, trouble swallowing, neck pain, voice change and sinus pressure.   Eyes: Negative for visual disturbance.  Respiratory: Negative for cough, shortness of breath, wheezing and stridor.   Cardiovascular: Negative for chest pain, palpitations and leg swelling.  Gastrointestinal: Negative for nausea, vomiting, abdominal pain,  diarrhea, constipation, blood in stool, abdominal distention and anal bleeding.  Genitourinary: Negative for dysuria and flank pain.  Musculoskeletal: Negative for myalgias, arthralgias and gait problem.  Skin: Negative for color change and rash.  Neurological: Negative for dizziness and headaches.  Hematological: Negative for adenopathy. Does not bruise/bleed easily.  Psychiatric/Behavioral: Positive for dysphoric mood. Negative for suicidal ideas and sleep disturbance. The patient is nervous/anxious.        Objective:   Physical Exam  Constitutional: She is oriented to person, place, and time. She appears well-developed and well-nourished. No distress.  HENT:  Head: Normocephalic and atraumatic.  Right Ear: External ear normal.  Left Ear: External ear normal.  Nose: Nose normal.  Mouth/Throat: Oropharynx is clear and moist. No oropharyngeal exudate.  Eyes: Conjunctivae normal are normal. Pupils are equal, round, and reactive to light. Right eye exhibits no discharge. Left eye exhibits no discharge. No scleral icterus.  Neck: Normal range of motion. Neck supple. No tracheal deviation present. No thyromegaly present.  Cardiovascular: Normal rate, regular rhythm, normal heart sounds and intact distal pulses.  Exam reveals no gallop and no friction rub.   No murmur heard. Pulmonary/Chest: Effort normal and breath sounds normal. No respiratory distress. She has no wheezes. She has no rales. She exhibits no tenderness.  Musculoskeletal: Normal range of motion. She exhibits no edema and no tenderness.  Lymphadenopathy:    She has no cervical adenopathy.  Neurological: She is alert and oriented to person, place, and time. No cranial nerve deficit. She exhibits normal muscle tone. Coordination normal.  Skin: Skin is warm and dry. No rash noted. She is not diaphoretic. No erythema. No pallor.  Psychiatric: She has a normal mood and affect. Her behavior is normal. Judgment and thought content  normal.          Assessment & Plan:

## 2012-05-19 ENCOUNTER — Other Ambulatory Visit: Payer: Self-pay | Admitting: Internal Medicine

## 2012-05-19 NOTE — Telephone Encounter (Signed)
Rx called to [facility], patient advised via telephone. 

## 2012-05-19 NOTE — Telephone Encounter (Signed)
Pt came in today checking on her rx for alprazalam   Pt is almost out of her meds This was to be sent in yesterday 11/4  [facility] [address]

## 2012-06-17 ENCOUNTER — Telehealth: Payer: Self-pay | Admitting: Internal Medicine

## 2012-06-17 NOTE — Telephone Encounter (Signed)
Patient is needing a stronger antidepressant .

## 2012-06-17 NOTE — Telephone Encounter (Signed)
Appt made and pt aware

## 2012-06-17 NOTE — Telephone Encounter (Signed)
Pt states that she feels more depressed than ever, has body aches and pains. Feels that she needs a stronger anti-depressant has been having these symptoms for about 2 weeks. Symptoms are worse in the morning.

## 2012-06-17 NOTE — Telephone Encounter (Signed)
Needs to be seen this week?

## 2012-06-19 ENCOUNTER — Encounter: Payer: Self-pay | Admitting: Internal Medicine

## 2012-06-19 ENCOUNTER — Ambulatory Visit (INDEPENDENT_AMBULATORY_CARE_PROVIDER_SITE_OTHER): Payer: Medicare Other | Admitting: Internal Medicine

## 2012-06-19 VITALS — BP 140/78 | HR 90 | Temp 98.0°F | Resp 15 | Wt 150.8 lb

## 2012-06-19 DIAGNOSIS — M316 Other giant cell arteritis: Secondary | ICD-10-CM

## 2012-06-19 DIAGNOSIS — Z1331 Encounter for screening for depression: Secondary | ICD-10-CM

## 2012-06-19 DIAGNOSIS — F411 Generalized anxiety disorder: Secondary | ICD-10-CM

## 2012-06-19 DIAGNOSIS — F329 Major depressive disorder, single episode, unspecified: Secondary | ICD-10-CM

## 2012-06-19 DIAGNOSIS — F419 Anxiety disorder, unspecified: Secondary | ICD-10-CM

## 2012-06-19 MED ORDER — SERTRALINE HCL 50 MG PO TABS: 50.0000 mg | ORAL_TABLET | Freq: Every day | ORAL | Status: DC

## 2012-06-19 NOTE — Progress Notes (Signed)
Subjective:    Patient ID: Tracy [name], female    DOB: 1935-01-30, 77 y.o.   MRN: [id number]  HPI 77 year old female with history of anxiety/depression and temporal arteritis presents for followup. She reports over the last several weeks her symptoms of depressed mood has increased. She is currently taking alprazolam as needed for anxiety. She typically takes 0.5 mg 3 times daily. She reports some improvement with this especially in terms of anxiety but no improvement in terms of depression. In the past, she had taken Remeron, however felt no improvement with this medication. She reports that symptoms of depressed mood have been worse since she has been taking prednisone. She is having some difficulty sleeping. She reports this time a year is difficult for her because of the recent death of her husband.  Outpatient Encounter Prescriptions as of 06/19/2012  Medication Sig Dispense Refill  . alendronate (FOSAMAX) 70 MG tablet Take 70 mg by mouth every 7 (seven) days. Take with a full glass of water on an empty stomach.      . ALPRAZolam (XANAX) 0.25 MG tablet Take 2 tablets (0.5 mg total) by mouth 3 (three) times daily as needed for sleep or anxiety.  120 tablet  3  . Calcium Carbonate (CALCIUM 500 PO) Take 1 capsule by mouth daily.        . Cholecalciferol (VITAMIN D3) 1000 UNITS CAPS Take 1 tablet by mouth daily.        . cyclobenzaprine (FLEXERIL) 10 MG tablet Take 1 tablet (10 mg total) by mouth every 8 (eight) hours as needed for muscle spasms.  30 tablet  1  . diazepam (VALIUM) 2 MG tablet Take 1 tablet (2 mg total) by mouth every 8 (eight) hours as needed.  90 tablet  3  . ferrous fumarate-iron polysaccharide complex (TANDEM) 162-115.2 MG CAPS Take 1 capsule by mouth daily with breakfast.      . ferrous sulfate 325 (65 FE) MG EC tablet Take 1 tablet (325 mg total) by mouth 3 (three) times daily with meals.  90 tablet  1  . fish oil-omega-3 fatty acids 1000 MG capsule Take 2 g by mouth  daily.        Marland Kitchen HYDROcodone-acetaminophen (NORCO) 5-325 MG per tablet Take 1 tablet by mouth every 8 (eight) hours as needed for pain.  90 tablet  3  . meloxicam (MOBIC) 15 MG tablet Take 15 mg by mouth daily as needed.      . predniSONE (DELTASONE) 20 MG tablet Take 20 mg by mouth daily.       BP 140/78  Pulse 90  Temp 98 F (36.7 C) (Oral)  Resp 15  Wt 150 lb 12 oz (68.38 kg)  Review of Systems  Constitutional: Negative for fever, chills, appetite change, fatigue and unexpected weight change.  HENT: Negative for ear pain, congestion, sore throat, trouble swallowing, neck pain, voice change and sinus pressure.   Eyes: Negative for visual disturbance.  Respiratory: Negative for cough, shortness of breath, wheezing and stridor.   Cardiovascular: Negative for chest pain, palpitations and leg swelling.  Gastrointestinal: Negative for nausea, vomiting, abdominal pain, diarrhea, constipation, blood in stool, abdominal distention and anal bleeding.  Genitourinary: Negative for dysuria and flank pain.  Musculoskeletal: Negative for myalgias, arthralgias and gait problem.  Skin: Negative for color change and rash.  Neurological: Negative for dizziness and headaches.  Hematological: Negative for adenopathy. Does not bruise/bleed easily.  Psychiatric/Behavioral: Positive for dysphoric mood. Negative for suicidal ideas and sleep disturbance.  The patient is nervous/anxious.        Objective:   Physical Exam  Constitutional: She is oriented to person, place, and time. She appears well-developed and well-nourished. No distress.  HENT:  Head: Normocephalic and atraumatic.  Right Ear: External ear normal.  Left Ear: External ear normal.  Nose: Nose normal.  Mouth/Throat: Oropharynx is clear and moist. No oropharyngeal exudate.  Eyes: Conjunctivae normal are normal. Pupils are equal, round, and reactive to light. Right eye exhibits no discharge. Left eye exhibits no discharge. No scleral icterus.   Neck: Normal range of motion. Neck supple. No tracheal deviation present. No thyromegaly present.  Cardiovascular: Normal rate, regular rhythm, normal heart sounds and intact distal pulses.  Exam reveals no gallop and no friction rub.   No murmur heard. Pulmonary/Chest: Effort normal and breath sounds normal. No respiratory distress. She has no wheezes. She has no rales. She exhibits no tenderness.  Musculoskeletal: Normal range of motion. She exhibits no edema and no tenderness.  Lymphadenopathy:    She has no cervical adenopathy.  Neurological: She is alert and oriented to person, place, and time. No cranial nerve deficit. She exhibits normal muscle tone. Coordination normal.  Skin: Skin is warm and dry. No rash noted. She is not diaphoretic. No erythema. No pallor.  Psychiatric: Her behavior is normal. Judgment and thought content normal. Cognition and memory are normal. She exhibits a depressed mood.          Assessment & Plan:

## 2012-06-19 NOTE — Assessment & Plan Note (Signed)
Offered reassurance that symptoms of increased anxiety and depressed mood can occur with long term use of prednisone. Obtained her verbal permission to place her in contact with a group of other patients who are also being treated for temporal arteritis.

## 2012-06-19 NOTE — Assessment & Plan Note (Signed)
Will try adding Zoloft to help with symptoms. Patient will start with 50 mg daily. Patient will call if any side effects from this medication. She will followup in one month or sooner as needed.

## 2012-06-29 ENCOUNTER — Telehealth: Payer: Self-pay | Admitting: Internal Medicine

## 2012-06-29 NOTE — Telephone Encounter (Signed)
Pt has an appt with Dr. Lorin Picket and another provider tomorrow morning.

## 2012-06-29 NOTE — Telephone Encounter (Signed)
Patient is having moments of confusion. Son wants her seen.

## 2012-06-29 NOTE — Telephone Encounter (Signed)
Patient Information:  Caller Name: S[place]  Phone: [phone]  Patient: Tracy [name]  Gender: Female  DOB: 31-Oct-1934  Age: 77 Years  PCP: Ronna Polio (Adults only)  Office Follow Up:  Does the office need to follow up with this patient?: No  Instructions For The Office: N/A   Symptoms  Reason For Call & Symptoms: increasing confusion, pt also feels dizzy and weak; pt is also hearing music that she cannot shut off.  Pt thought it was originally a reaction to medication  Reviewed Health History In EMR: Yes  Reviewed Medications In EMR: Yes  Reviewed Allergies In EMR: Yes  Reviewed Surgeries / Procedures: Yes  Date of Onset of Symptoms: 06/22/2012  Guideline(s) Used:  Dizziness  Disposition Per Guideline:   Go to ED Now (or to Office with PCP Approval)  Reason For Disposition Reached:   Severe dizziness (e.g., unable to stand, requires support to walk, feels like passing out now)  Advice Given:  Call Back If:  You become worse.  Patient Refused Recommendation:  Patient Refused Care Advice  pt wants appt scheduled for tomorrow  RN advised for pt to be seen in the ED.  PT refused.  PT states she has an appt scheduled for 1145 on 12/17 to be seen by her rheumatologist.  Pt wants appt for tomorrow to see her PCP.  Appt scheduled for 1445 on 12/17 with Dr Lorin Picket (Dr Dan Humphreys had no appts available)

## 2012-06-29 NOTE — Telephone Encounter (Signed)
I called patient and sent the call to [facility].

## 2012-06-29 NOTE — Telephone Encounter (Signed)
Erie Noe, This should have gone to [facility].  Shanda Bumps, Can you please get more information. We have no open visits, so will either need to go to urgent care or ED.

## 2012-06-30 ENCOUNTER — Encounter: Payer: Self-pay | Admitting: Internal Medicine

## 2012-06-30 ENCOUNTER — Ambulatory Visit (INDEPENDENT_AMBULATORY_CARE_PROVIDER_SITE_OTHER): Payer: Medicare Other | Admitting: Internal Medicine

## 2012-06-30 VITALS — BP 158/80 | HR 74 | Temp 98.0°F | Ht 64.0 in | Wt 150.5 lb

## 2012-06-30 DIAGNOSIS — D509 Iron deficiency anemia, unspecified: Secondary | ICD-10-CM

## 2012-06-30 DIAGNOSIS — R2681 Unsteadiness on feet: Secondary | ICD-10-CM

## 2012-06-30 DIAGNOSIS — F411 Generalized anxiety disorder: Secondary | ICD-10-CM

## 2012-06-30 DIAGNOSIS — R4182 Altered mental status, unspecified: Secondary | ICD-10-CM

## 2012-06-30 DIAGNOSIS — R030 Elevated blood-pressure reading, without diagnosis of hypertension: Secondary | ICD-10-CM

## 2012-06-30 DIAGNOSIS — M316 Other giant cell arteritis: Secondary | ICD-10-CM

## 2012-06-30 DIAGNOSIS — F419 Anxiety disorder, unspecified: Secondary | ICD-10-CM

## 2012-06-30 DIAGNOSIS — R269 Unspecified abnormalities of gait and mobility: Secondary | ICD-10-CM

## 2012-06-30 LAB — COMPREHENSIVE METABOLIC PANEL
AST: 20 U/L (ref 0–37)
Albumin: 4.2 g/dL (ref 3.5–5.2)
Alkaline Phosphatase: 91 U/L (ref 39–117)
BUN: 10 mg/dL (ref 6–23)
Potassium: 3 mEq/L — ABNORMAL LOW (ref 3.5–5.1)
Sodium: 128 mEq/L — ABNORMAL LOW (ref 135–145)
Total Protein: 7.5 g/dL (ref 6.0–8.3)

## 2012-06-30 LAB — POCT URINALYSIS DIPSTICK
Bilirubin, UA: NEGATIVE
Blood, UA: NEGATIVE
Glucose, UA: NEGATIVE
Nitrite, UA: NEGATIVE

## 2012-06-30 LAB — CBC WITH DIFFERENTIAL/PLATELET
Eosinophils Absolute: 0 10*3/uL (ref 0.0–0.7)
MCHC: 33.1 g/dL (ref 30.0–36.0)
MCV: 95.8 fl (ref 78.0–100.0)
Monocytes Absolute: 0.3 10*3/uL (ref 0.1–1.0)
Neutrophils Relative %: 93 % — ABNORMAL HIGH (ref 43.0–77.0)
Platelets: 355 10*3/uL (ref 150.0–400.0)

## 2012-06-30 LAB — SEDIMENTATION RATE: Sed Rate: 18 mm/hr (ref 0–22)

## 2012-06-30 LAB — FERRITIN: Ferritin: 134.5 ng/mL (ref 10.0–291.0)

## 2012-06-30 LAB — VITAMIN B12: Vitamin B-12: 577 pg/mL (ref 211–911)

## 2012-06-30 NOTE — Assessment & Plan Note (Signed)
On no medication now.  Wants to remain off for now.  Discussed side effects of just stopping medication abruptly.  Follow.

## 2012-06-30 NOTE — Progress Notes (Signed)
Subjective:    Patient ID: Tracy [name], female    DOB: [birth year], 77 y.o.   MRN: [id number]  HPI 77 year old female with past history of anxiety, depression and temporal arteritis.  She comes in today (accompanied by her son) as a work in with concerns regarding intermittent periods of confusion, weakness and dizziness.  The dizziness has been present for approximately one month.  The confusion - for a few weeks.  Seems to have episodes where it is worse.  The family has been around her more lately.  They have been trying to make sure she is eating well.  Pt denies any nausea or vomiting.  No diarrhea.  No chest pain, increased heart rate or palpitations.  No abdominal pain.  She feels some better today.  She did fall over the weekend.  She did hit her head.  Son states it was a witnessed fall - and describes it as "not a hard fall".  She does have a bruise on her head.  She denies any change in sensation (ie,  pain or dizziness) since the fall.  She also states she has been hearing music - when others can't hear it.  She is being treated for temporal arteritis.  Tapering down the prednisone.  Son had a lot of questions about her medications.  She has now stopped all of her medications except the Ibuprofen, Prilosec and prednisone.  She states she feels better off the medication.  Son feels she is doing better since being off the medication.   Past Medical History  Diagnosis Date  . Allergy   . TMJ (temporomandibular joint syndrome)   . Breast cancer 2000    right, s/p lumpectomy, XRT, chemo    Review of Systems Patient reports the discomfort in her head and dizziness.  She states the sensation in her head has been present since her diagnosis of temporal arteritis.  No change since her fall.  She is eating and drinking.   No chest pain, tightness or palpitations.  No increased shortness of breath, cough or congestion.  No nausea or vomiting.  No abdominal pain or cramping.  No bowel change,  such as diarrhea, constipation, BRBPR or melana.  She does report noticing some increased urinary frequency.        Objective:   Physical Exam Filed Vitals:   06/30/12 0951  BP: 158/80  Pulse: 74  Temp: 98 F (36.7 C)   Blood pressure recheck standing 148/84, lying 87/71  77 year old female in no acute distress.   HEENT:  Nares - clear.  OP- without lesions or erythema.  Minimal bruising on left lateral head. NECK:  Supple, nontender.  No audible bruit.   HEART:  Appears to be regular with premature beats initially.  Then RRR.   LUNGS:  Without crackles or wheezing audible.  Respirations even and unlabored.   RADIAL PULSE:  Equal bilaterally.  ABDOMEN:  Soft, nontender.  No audible abdominal bruit.   EXTREMITIES:  No increased edema to be present.             NEURO.  Alert and answering questions appropriately.  Able to sit and stand and turn without significant difficulty.      Assessment & Plan:  CONFUSION AND DIZZINESS.  Unclear as to the exact etiology.  May be multifactorial.  Need to rule out infection.  Will check routine labs and urine.  Reviewed medications.  She is now off all of her medications except prednisone,  ibuprofen, and prilosec.  We discussed possible reactions and side effects of stopping some of these medications abruptly.  She has been on prednisone therapy.  Discussed possible side effects of high dose prednisone therapy, but she is on less now then she was previously.  We also discussed her recent fall and the possibility of a bleed, etc.  Son reports she did not seem to fall "hard" and he actually feels she is better now then she has been recently.  He feels she may have "turned the corner".  Discussed w/up including scanning her head, checking labs and urine and EKG.  She agreed to have labs and urine checked, but wanted to hold on any further w/up at this time.  She is due to see Dr Gavin Potters this am and wants to discuss the above with him.   Discussed all of  the above at length with her and her son.  Explained that I needed to do more w/up to try and determine the etiology of her symptoms.  She agreed to lab testing and urine and agreed if symptoms changed or worsened in any way, she would be reevaluated.    CARDIOLOGY.  Initially on exam, heart appeared to be regular with premature beats (do not think was an irregular rhythm).   Repeat exam - appeared to be regular.  She declined EKG today.

## 2012-06-30 NOTE — Assessment & Plan Note (Signed)
Blood pressure was a little elevated today.  Will hold on medication.  Check labs.  Follow.  If persistent, may require additional treatment.  Pt states she is very anxious today.

## 2012-06-30 NOTE — Assessment & Plan Note (Signed)
Tapering prednisone now.  Has a follow up with Dr Gavin Potters today.  Will check ESR and discuss her current condition with him.

## 2012-07-01 ENCOUNTER — Other Ambulatory Visit (INDEPENDENT_AMBULATORY_CARE_PROVIDER_SITE_OTHER): Payer: Medicare Other

## 2012-07-01 ENCOUNTER — Encounter: Payer: Self-pay | Admitting: Internal Medicine

## 2012-07-01 DIAGNOSIS — D509 Iron deficiency anemia, unspecified: Secondary | ICD-10-CM

## 2012-07-01 LAB — BASIC METABOLIC PANEL
BUN: 11 mg/dL (ref 6–23)
CO2: 22 mEq/L (ref 19–32)
Calcium: 9.3 mg/dL (ref 8.4–10.5)
Creatinine, Ser: 0.8 mg/dL (ref 0.4–1.2)
Glucose, Bld: 115 mg/dL — ABNORMAL HIGH (ref 70–99)

## 2012-07-02 ENCOUNTER — Telehealth: Payer: Self-pay | Admitting: Internal Medicine

## 2012-07-02 LAB — URINE CULTURE

## 2012-07-02 NOTE — Telephone Encounter (Signed)
FYI.  Pt notified via My Chart - urine culture negative.

## 2012-07-10 ENCOUNTER — Encounter: Payer: Self-pay | Admitting: Internal Medicine

## 2012-07-10 ENCOUNTER — Encounter: Payer: Self-pay | Admitting: General Practice

## 2012-07-10 ENCOUNTER — Ambulatory Visit (INDEPENDENT_AMBULATORY_CARE_PROVIDER_SITE_OTHER): Payer: Medicare Other | Admitting: Internal Medicine

## 2012-07-10 VITALS — BP 140/76 | HR 85 | Temp 97.5°F | Resp 16 | Ht 63.5 in | Wt 148.0 lb

## 2012-07-10 DIAGNOSIS — F329 Major depressive disorder, single episode, unspecified: Secondary | ICD-10-CM

## 2012-07-10 DIAGNOSIS — E871 Hypo-osmolality and hyponatremia: Secondary | ICD-10-CM

## 2012-07-10 DIAGNOSIS — M316 Other giant cell arteritis: Secondary | ICD-10-CM

## 2012-07-10 LAB — C-REACTIVE PROTEIN: CRP: 1.2 mg/dL (ref 0.5–20.0)

## 2012-07-10 LAB — CBC WITH DIFFERENTIAL/PLATELET
Basophils Relative: 0.2 % (ref 0.0–3.0)
Eosinophils Relative: 0.1 % (ref 0.0–5.0)
Hemoglobin: 13.4 g/dL (ref 12.0–15.0)
MCV: 97.1 fl (ref 78.0–100.0)
Monocytes Absolute: 0.6 10*3/uL (ref 0.1–1.0)
Neutrophils Relative %: 89.3 % — ABNORMAL HIGH (ref 43.0–77.0)
RBC: 4.18 Mil/uL (ref 3.87–5.11)
WBC: 15.1 10*3/uL — ABNORMAL HIGH (ref 4.5–10.5)

## 2012-07-10 LAB — COMPREHENSIVE METABOLIC PANEL
AST: 21 U/L (ref 0–37)
Alkaline Phosphatase: 143 U/L — ABNORMAL HIGH (ref 39–117)
BUN: 12 mg/dL (ref 6–23)
Creatinine, Ser: 0.6 mg/dL (ref 0.4–1.2)
Total Bilirubin: 0.7 mg/dL (ref 0.3–1.2)

## 2012-07-10 MED ORDER — PREDNISONE 20 MG PO TABS: 10.0000 mg | ORAL_TABLET | Freq: Every day | ORAL | Status: DC

## 2012-07-10 NOTE — Assessment & Plan Note (Signed)
Noted to have hyponatremia on previous labs which improved with a recheck. Will repeat electrolytes today, given recent change in prednisone dosing.

## 2012-07-10 NOTE — Assessment & Plan Note (Signed)
Symptoms of depression are persistent. Patient has stopped all of her medications including sertraline and Xanax. She appears to be doing better off medication. Will set up psychology evaluation for counseling. Encouraged her to look into the book, Feeling Good, by Burns in the interim.

## 2012-07-10 NOTE — Assessment & Plan Note (Signed)
Symptomatically doing well. Dose of prednisone recently decreased by rheumatologist. Will check markers of inflammation including ESR and CRP with labs today. Followup in 2 months.

## 2012-07-10 NOTE — Patient Instructions (Signed)
Feeling Good - Burns - workbook

## 2012-07-10 NOTE — Progress Notes (Signed)
Subjective:    Patient ID: Tracy [name], female    DOB: Dec 18, 1934, 77 y.o.   MRN: [id number]  HPI 77 year old female with history of temporal arteritis, osteoporosis, and depression presents for followup. She presents with her [name] and grandson. They had noted some increased confusion over the last few weeks. She was evaluated by another physician in our office and had lab work which was markable for hyponatremia. The family ultimately decided to stop all of her medications except for prednisone and Fosamax. They report that she may have been taking her sertraline and appropriately, taking several pills at a time on intermittent days. With stopping her medications, they have noted a significant improvement in her mentation and alertness. She is no longer having episodes of confusion. Repeat electrolytes showed improved sodium level. She reports she is generally feeling well. She was just seen by her rheumatologist and dose of prednisone was decreased to 10 mg daily. She has met another patient with temporal arteritis and they have been providing support for one another. She is concerned about ongoing symptoms of depression. She reports this is worse in the morning when she wakes up in his face of the fact that her husband is no longer with her. She feels that she may need medication to help her make it through this time.  Outpatient Encounter Prescriptions as of 07/10/2012  Medication Sig Dispense Refill  . alendronate (FOSAMAX) 70 MG tablet Take 70 mg by mouth every 7 (seven) days. Take with a full glass of water on an empty stomach.      . predniSONE (DELTASONE) 20 MG tablet Take 0.5 tablets (10 mg total) by mouth daily.  15 tablet  0  . [DISCONTINUED] predniSONE (DELTASONE) 20 MG tablet Take 20 mg by mouth daily.      . Calcium Carbonate (CALCIUM 500 PO) Take 1 capsule by mouth daily.        . Cholecalciferol (VITAMIN D3) 1000 UNITS CAPS Take 1 tablet by mouth daily.        Marland Kitchen omeprazole  (PRILOSEC) 20 MG capsule Take 20 mg by mouth daily.       BP 140/76  Pulse 85  Temp 97.5 F (36.4 C) (Oral)  Resp 16  Ht 5' 3.5" (1.613 m)  Wt 148 lb (67.132 kg)  BMI 25.81 kg/m2  SpO2 98%  Review of Systems  Constitutional: Negative for fever, chills, appetite change, fatigue and unexpected weight change.  HENT: Negative for ear pain, congestion, sore throat, trouble swallowing, neck pain, voice change and sinus pressure.   Eyes: Negative for visual disturbance.  Respiratory: Negative for cough, shortness of breath, wheezing and stridor.   Cardiovascular: Negative for chest pain, palpitations and leg swelling.  Gastrointestinal: Negative for nausea, vomiting, abdominal pain, diarrhea, constipation, blood in stool, abdominal distention and anal bleeding.  Genitourinary: Negative for dysuria and flank pain.  Musculoskeletal: Negative for myalgias, arthralgias and gait problem.  Skin: Negative for color change and rash.  Neurological: Negative for dizziness and headaches.  Hematological: Negative for adenopathy. Does not bruise/bleed easily.  Psychiatric/Behavioral: Positive for dysphoric mood. Negative for suicidal ideas and sleep disturbance. The patient is not nervous/anxious.        Objective:   Physical Exam  Constitutional: She is oriented to person, place, and time. She appears well-developed and well-nourished. No distress.  HENT:  Head: Normocephalic and atraumatic.  Right Ear: External ear normal.  Left Ear: External ear normal.  Nose: Nose normal.  Mouth/Throat: Oropharynx is clear and  moist. No oropharyngeal exudate.  Eyes: Conjunctivae normal are normal. Pupils are equal, round, and reactive to light. Right eye exhibits no discharge. Left eye exhibits no discharge. No scleral icterus.  Neck: Normal range of motion. Neck supple. No tracheal deviation present. No thyromegaly present.  Cardiovascular: Normal rate, regular rhythm, normal heart sounds and intact distal  pulses.  Exam reveals no gallop and no friction rub.   No murmur heard. Pulmonary/Chest: Effort normal and breath sounds normal. No respiratory distress. She has no wheezes. She has no rales. She exhibits no tenderness.  Musculoskeletal: Normal range of motion. She exhibits no edema and no tenderness.  Lymphadenopathy:    She has no cervical adenopathy.  Neurological: She is alert and oriented to person, place, and time. No cranial nerve deficit. She exhibits normal muscle tone. Coordination normal.  Skin: Skin is warm and dry. No rash noted. She is not diaphoretic. No erythema. No pallor.  Psychiatric: She has a normal mood and affect. Her behavior is normal. Judgment and thought content normal.          Assessment & Plan:

## 2012-07-20 ENCOUNTER — Encounter: Payer: Self-pay | Admitting: Adult Health

## 2012-07-20 ENCOUNTER — Ambulatory Visit (INDEPENDENT_AMBULATORY_CARE_PROVIDER_SITE_OTHER): Payer: Medicare Other | Admitting: Adult Health

## 2012-07-20 VITALS — BP 179/102 | HR 88 | Temp 98.1°F | Ht 62.0 in | Wt 145.0 lb

## 2012-07-20 DIAGNOSIS — N390 Urinary tract infection, site not specified: Secondary | ICD-10-CM

## 2012-07-20 DIAGNOSIS — R030 Elevated blood-pressure reading, without diagnosis of hypertension: Secondary | ICD-10-CM

## 2012-07-20 LAB — POCT URINALYSIS DIPSTICK
Bilirubin, UA: NEGATIVE
Nitrite, UA: POSITIVE
Protein, UA: NEGATIVE
pH, UA: 5

## 2012-07-20 MED ORDER — CIPROFLOXACIN HCL 500 MG PO TABS: 500.0000 mg | ORAL_TABLET | Freq: Two times a day (BID) | ORAL | Status: AC

## 2012-07-20 NOTE — Assessment & Plan Note (Signed)
Blood pressure elevated this visit. Patient presents with s/s of UTI but reports increased anxiety 2/2 having MRI tomorrow. Patient has had one other incident when she presented with elevated B/P and was also very anxious. Pt will need to f/u after MRI done. Dr. Dan Humphreys had discussed possibility of starting amlodipine if b/p continues to stay elevated.

## 2012-07-20 NOTE — Progress Notes (Signed)
  Subjective:    Patient ID: Tracy [name], female    DOB: Dec 30, 1934, 78 y.o.   MRN: [id number]  HPI  Pt presents to clinic with c/o foul smelling, cloudy urine, frequency. She does not have any dysuria but she has been on prednisone for temporal arteritis. Patient noticed these symptoms over the weekend. Patient is also very nervous because she is having any MRI done tomorrow.  Current Outpatient Prescriptions on File Prior to Visit  Medication Sig Dispense Refill  . alendronate (FOSAMAX) 70 MG tablet Take 70 mg by mouth every 7 (seven) days. Take with a full glass of water on an empty stomach.      . Calcium Carbonate (CALCIUM 500 PO) Take 1 capsule by mouth daily.        . Cholecalciferol (VITAMIN D3) 1000 UNITS CAPS Take 1 tablet by mouth daily.        . predniSONE (DELTASONE) 20 MG tablet Take 0.5 tablets (10 mg total) by mouth daily.  15 tablet  0  . omeprazole (PRILOSEC) 20 MG capsule Take 20 mg by mouth daily.         Review of Systems  Constitutional: Negative for fever and chills.  HENT: Negative.   Eyes: Negative.   Respiratory: Negative.   Cardiovascular: Negative.   Gastrointestinal: Negative.   Genitourinary: Positive for frequency. Negative for dysuria, hematuria and flank pain.       Foul odor to urine. Very cloudy.  Musculoskeletal: Negative.   Neurological: Positive for dizziness.  Psychiatric/Behavioral: Negative.          Objective:   Physical Exam  Constitutional: She is oriented to person, place, and time. She appears well-developed and well-nourished. No distress.  HENT:  Head: Normocephalic.  Cardiovascular: Normal rate and regular rhythm.   Pulmonary/Chest: Effort normal.  Abdominal: Soft. There is no tenderness.  Neurological: She is alert and oriented to person, place, and time.  Skin: Skin is warm and dry.  Psychiatric: She has a normal mood and affect.       Appears nervous          Assessment & Plan:

## 2012-07-20 NOTE — Assessment & Plan Note (Signed)
UA dipstick positive for nitrites and leukocytes. Start Cipro 500 mg bid x 7 days. RTC if symptoms do not improve within 2-3 days.

## 2012-07-20 NOTE — Patient Instructions (Addendum)
  You have a urinary tract infection. Please start your antibiotic today.  I have sent the urine for culture. I will let you know if you need to be on a different antibiotic.  Please call the office if you do not feel any improvement within the next 2-3 days.

## 2012-07-21 ENCOUNTER — Ambulatory Visit: Payer: Self-pay | Admitting: Rheumatology

## 2012-07-22 LAB — URINE CULTURE: Colony Count: >=100000

## 2012-07-23 ENCOUNTER — Ambulatory Visit: Payer: Medicare Other | Admitting: Internal Medicine

## 2012-07-23 ENCOUNTER — Telehealth: Payer: Self-pay | Admitting: Internal Medicine

## 2012-07-23 NOTE — Telephone Encounter (Signed)
I received report on MRI from 07/21/2012 that showed possible subacute infarction (stroke). Was patient having new symptoms that led to getting MRI? Has this been addressed with her rheumatologist?

## 2012-07-23 NOTE — Telephone Encounter (Signed)
Spoke with patient and she stated that she is feeling ok but she is worried that she may lose her eye sight.  She sees Dr. Gavin Potters (rheumatologist) tomorrow at 8:30 and she see the neurologist at 9:45, both are at [facility].  She has an appt with the psychologist on Friday.

## 2012-07-30 ENCOUNTER — Ambulatory Visit: Payer: Self-pay | Admitting: Rheumatology

## 2012-08-17 ENCOUNTER — Ambulatory Visit (INDEPENDENT_AMBULATORY_CARE_PROVIDER_SITE_OTHER): Payer: Medicare Other | Admitting: Internal Medicine

## 2012-08-17 ENCOUNTER — Encounter: Payer: Self-pay | Admitting: Internal Medicine

## 2012-08-17 VITALS — BP 168/90 | HR 94 | Temp 98.1°F | Ht 63.0 in | Wt 153.0 lb

## 2012-08-17 DIAGNOSIS — F411 Generalized anxiety disorder: Secondary | ICD-10-CM

## 2012-08-17 DIAGNOSIS — R030 Elevated blood-pressure reading, without diagnosis of hypertension: Secondary | ICD-10-CM

## 2012-08-17 DIAGNOSIS — I6529 Occlusion and stenosis of unspecified carotid artery: Secondary | ICD-10-CM

## 2012-08-17 DIAGNOSIS — K219 Gastro-esophageal reflux disease without esophagitis: Secondary | ICD-10-CM | POA: Insufficient documentation

## 2012-08-17 DIAGNOSIS — F419 Anxiety disorder, unspecified: Secondary | ICD-10-CM

## 2012-08-17 DIAGNOSIS — I6522 Occlusion and stenosis of left carotid artery: Secondary | ICD-10-CM | POA: Insufficient documentation

## 2012-08-17 MED ORDER — VENLAFAXINE HCL ER 37.5 MG PO CP24: 37.5000 mg | ORAL_CAPSULE | Freq: Every day | ORAL | Status: DC

## 2012-08-17 MED ORDER — RANITIDINE HCL 150 MG PO TABS: 150.0000 mg | ORAL_TABLET | Freq: Two times a day (BID) | ORAL | Status: DC

## 2012-08-17 MED ORDER — CLOPIDOGREL BISULFATE 75 MG PO TABS: 75.0000 mg | ORAL_TABLET | Freq: Every day | ORAL | Status: AC

## 2012-08-17 MED ORDER — ATORVASTATIN CALCIUM 20 MG PO TABS: 20.0000 mg | ORAL_TABLET | Freq: Every day | ORAL | Status: DC

## 2012-08-17 NOTE — Assessment & Plan Note (Signed)
Symptoms poorly controlled. Will add Effexor 37.5mg  po daily. Pt will follow up in 2 weeks.

## 2012-08-17 NOTE — Assessment & Plan Note (Signed)
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  Decision made to follow medically with aspirin, plavix, control of BP, statin.  BP elevated today. Discussed importance of BP control. Pt prefers not to add medication now. Attributes elevated BP to anxiety. Will plan to recheck BP in  2 weeks.

## 2012-08-17 NOTE — Progress Notes (Signed)
Subjective:    Patient ID: Tracy [name], female    DOB: 09/26/1934, 78 y.o.   MRN: [id number]  HPI 78 year old female with history of temporal arteritis, recent CVA presents for followup. She was recently evaluated by her rheumatologist after having left arm numbness. She was found to have right-sided CVA. She was referred to neurology and was started on clopidogrel. She has been taking aspirin, clopidogrel, and statin medication. She denies any recurrence of symptoms. She reports that she had ultrasound of her carotids which showed 100% occlusion of her left carotid. Decision was made to monitor her medically.  She reports she has been feeling generally well. She continues to have some issues with anxiety and depression. She reports very poor control with use of alprazolam. She reports that the medication helps somewhat but then wears off quickly and symptoms are worse after it wears off. She would like to try something different.  Outpatient Encounter Prescriptions as of 08/17/2012  Medication Sig Dispense Refill  . alendronate (FOSAMAX) 70 MG tablet Take 70 mg by mouth every 7 (seven) days. Take with a full glass of water on an empty stomach.      . ALPRAZolam (XANAX) 0.25 MG tablet Take 2 by mouth three times a day as needed      . atorvastatin (LIPITOR) 20 MG tablet Take 1 tablet (20 mg total) by mouth daily.  90 tablet  4  . Calcium Carbonate (CALCIUM 500 PO) Take 1 capsule by mouth daily.        . Cholecalciferol (VITAMIN D3) 1000 UNITS CAPS Take 1 tablet by mouth daily.        . clopidogrel (PLAVIX) 75 MG tablet Take 1 tablet (75 mg total) by mouth daily.  90 tablet  4  . doxylamine, Sleep, (UNISOM) 25 MG tablet Take 25 mg by mouth at bedtime as needed.      . Omega-3 Fatty Acids (FISH OIL) 1000 MG CAPS Take 2 by mouth daily      . predniSONE (DELTASONE) 20 MG tablet Take 0.5 tablets (10 mg total) by mouth daily.  15 tablet  0  . [DISCONTINUED] atorvastatin (LIPITOR) 20 MG tablet Take  20 mg by mouth daily.      . [DISCONTINUED] clopidogrel (PLAVIX) 75 MG tablet Take 75 mg by mouth daily.      . [DISCONTINUED] omeprazole (PRILOSEC) 20 MG capsule Take 20 mg by mouth daily as needed.       . ranitidine (ZANTAC) 150 MG tablet Take 1 tablet (150 mg total) by mouth 2 (two) times daily.  90 tablet  3  . venlafaxine XR (EFFEXOR-XR) 37.5 MG 24 hr capsule Take 1 capsule (37.5 mg total) by mouth daily.  30 capsule  6   BP 168/90  Pulse 94  Temp 98.1 F (36.7 C) (Oral)  Ht 5\' 3"  (1.6 m)  Wt 153 lb (69.4 kg)  BMI 27.10 kg/m2  SpO2 97%  Review of Systems  Constitutional: Negative for fever, chills, appetite change, fatigue and unexpected weight change.  HENT: Negative for ear pain, congestion, sore throat, trouble swallowing, neck pain, voice change and sinus pressure.   Eyes: Negative for visual disturbance.  Respiratory: Negative for cough, shortness of breath, wheezing and stridor.   Cardiovascular: Negative for chest pain, palpitations and leg swelling.  Gastrointestinal: Negative for nausea, vomiting, abdominal pain, diarrhea, constipation, blood in stool, abdominal distention and anal bleeding.  Genitourinary: Negative for dysuria and flank pain.  Musculoskeletal: Negative for myalgias, arthralgias and gait  problem.  Skin: Negative for color change and rash.  Neurological: Negative for dizziness and headaches.  Hematological: Negative for adenopathy. Does not bruise/bleed easily.  Psychiatric/Behavioral: Positive for dysphoric mood. Negative for suicidal ideas and sleep disturbance. The patient is nervous/anxious.        Objective:   Physical Exam  Constitutional: She is oriented to person, place, and time. She appears well-developed and well-nourished. No distress.  HENT:  Head: Normocephalic and atraumatic.  Right Ear: External ear normal.  Left Ear: External ear normal.  Nose: Nose normal.  Mouth/Throat: Oropharynx is clear and moist. No oropharyngeal exudate.   Eyes: Conjunctivae normal are normal. Pupils are equal, round, and reactive to light. Right eye exhibits no discharge. Left eye exhibits no discharge. No scleral icterus.  Neck: Normal range of motion. Neck supple. No tracheal deviation present. No thyromegaly present.  Cardiovascular: Normal rate, regular rhythm, normal heart sounds and intact distal pulses.  Exam reveals no gallop and no friction rub.   No murmur heard. Pulmonary/Chest: Effort normal and breath sounds normal. No respiratory distress. She has no wheezes. She has no rales. She exhibits no tenderness.  Musculoskeletal: Normal range of motion. She exhibits no edema and no tenderness.  Lymphadenopathy:    She has no cervical adenopathy.  Neurological: She is alert and oriented to person, place, and time. No cranial nerve deficit. She exhibits normal muscle tone. Coordination normal.  Skin: Skin is warm and dry. No rash noted. She is not diaphoretic. No erythema. No pallor.  Psychiatric: She has a normal mood and affect. Her behavior is normal. Judgment and thought content normal.          Assessment & Plan:

## 2012-08-17 NOTE — Assessment & Plan Note (Signed)
BP elevated today. Recommended starting medication. Pt would prefer to hold off. No willing to start another new medication. Will have her follow up 2 weeks for recheck.

## 2012-08-17 NOTE — Assessment & Plan Note (Signed)
Discussed interaction between Clopidogrel and Omeprazole. Will change to Zantac.

## 2012-08-24 ENCOUNTER — Ambulatory Visit: Payer: Medicare Other | Admitting: Psychology

## 2012-08-25 ENCOUNTER — Ambulatory Visit: Payer: Medicare Other | Admitting: Psychology

## 2012-08-29 ENCOUNTER — Other Ambulatory Visit: Payer: Self-pay

## 2012-08-31 ENCOUNTER — Ambulatory Visit (INDEPENDENT_AMBULATORY_CARE_PROVIDER_SITE_OTHER): Payer: Medicare Other | Admitting: Internal Medicine

## 2012-08-31 ENCOUNTER — Encounter: Payer: Self-pay | Admitting: Internal Medicine

## 2012-08-31 VITALS — BP 170/110 | HR 72 | Temp 98.1°F | Wt 153.0 lb

## 2012-08-31 DIAGNOSIS — R030 Elevated blood-pressure reading, without diagnosis of hypertension: Secondary | ICD-10-CM

## 2012-08-31 DIAGNOSIS — F419 Anxiety disorder, unspecified: Secondary | ICD-10-CM

## 2012-08-31 DIAGNOSIS — F411 Generalized anxiety disorder: Secondary | ICD-10-CM

## 2012-08-31 MED ORDER — VENLAFAXINE HCL ER 75 MG PO CP24: 75.0000 mg | ORAL_CAPSULE | Freq: Every day | ORAL | Status: DC

## 2012-08-31 NOTE — Assessment & Plan Note (Signed)
BP again markedly elevated. Recommended starting medication. Discussed that chronic use of prednisone can lead to elevated BP. Pt recently had CVA which may be related to elevated BP. Pt declines, would prefer to hold off on adding another medication. Follow up 2 weeks for recheck.

## 2012-08-31 NOTE — Assessment & Plan Note (Signed)
Anxiety persistent. Worsened with alprazolam. Will discontinue. Will increase Venlafaxine to 75mg  daily. Follow up 2 weeks.

## 2012-08-31 NOTE — Progress Notes (Signed)
Subjective:    Patient ID: Tracy [name], female    DOB: 24-Sep-1934, 78 y.o.   MRN: [id number]  HPI 78 year old female with history of temporal arteritis, CVA, hypertension, anxiety presents for followup. She reports that symptoms of anxiety are made worse with use of alprazolam. She has been taking Effexor but not on a regular basis. She reports continued symptoms particularly in the morning of anxiety and depressed mood.  She denies any other concerns today. Denies any chest pain, headache, palpitations. She did not bring record of BP today, however reports that BP has been intermittently low at home. She is not interested in starting BP medication at this time.  Outpatient Encounter Prescriptions as of 08/31/2012  Medication Sig Dispense Refill  . alendronate (FOSAMAX) 70 MG tablet Take 70 mg by mouth every 7 (seven) days. Take with a full glass of water on an empty stomach.      Marland Kitchen atorvastatin (LIPITOR) 20 MG tablet Take 1 tablet (20 mg total) by mouth daily.  90 tablet  4  . Calcium Carbonate (CALCIUM 500 PO) Take 1 capsule by mouth daily.        . Cholecalciferol (VITAMIN D3) 1000 UNITS CAPS Take 1 tablet by mouth daily.        . clopidogrel (PLAVIX) 75 MG tablet Take 1 tablet (75 mg total) by mouth daily.  90 tablet  4  . doxylamine, Sleep, (UNISOM) 25 MG tablet Take 25 mg by mouth at bedtime as needed.      . Omega-3 Fatty Acids (FISH OIL) 1000 MG CAPS Take 2 by mouth daily      . predniSONE (DELTASONE) 20 MG tablet Take 0.5 tablets (10 mg total) by mouth daily.  15 tablet  0  . ranitidine (ZANTAC) 150 MG tablet Take 1 tablet (150 mg total) by mouth 2 (two) times daily.  90 tablet  3  . venlafaxine XR (EFFEXOR-XR) 75 MG 24 hr capsule Take 1 capsule (75 mg total) by mouth daily.  30 capsule  6  . [DISCONTINUED] ALPRAZolam (XANAX) 0.25 MG tablet Take 2 by mouth three times a day as needed      . [DISCONTINUED] venlafaxine XR (EFFEXOR-XR) 37.5 MG 24 hr capsule Take 1 capsule (37.5 mg  total) by mouth daily.  30 capsule  6   No facility-administered encounter medications on file as of 08/31/2012.   BP 170/110  Pulse 72  Temp(Src) 98.1 F (36.7 C) (Oral)  Wt 153 lb (69.4 kg)  BMI 27.11 kg/m2  SpO2 97%  Review of Systems  Constitutional: Negative for fever, chills, appetite change, fatigue and unexpected weight change.  HENT: Negative for ear pain, congestion, sore throat, trouble swallowing, neck pain, voice change and sinus pressure.   Eyes: Negative for visual disturbance.  Respiratory: Negative for cough, shortness of breath, wheezing and stridor.   Cardiovascular: Negative for chest pain, palpitations and leg swelling.  Gastrointestinal: Negative for nausea, vomiting, abdominal pain, diarrhea, constipation, blood in stool, abdominal distention and anal bleeding.  Genitourinary: Negative for dysuria and flank pain.  Musculoskeletal: Negative for myalgias, arthralgias and gait problem.  Skin: Negative for color change and rash.  Neurological: Negative for dizziness and headaches.  Hematological: Negative for adenopathy. Does not bruise/bleed easily.  Psychiatric/Behavioral: Negative for suicidal ideas, sleep disturbance and dysphoric mood. The patient is nervous/anxious.        Objective:   Physical Exam  Constitutional: She is oriented to person, place, and time. She appears well-developed and well-nourished. No distress.  HENT:  Head: Normocephalic and atraumatic.  Right Ear: External ear normal.  Left Ear: External ear normal.  Nose: Nose normal.  Mouth/Throat: Oropharynx is clear and moist. No oropharyngeal exudate.  Eyes: Conjunctivae are normal. Pupils are equal, round, and reactive to light. Right eye exhibits no discharge. Left eye exhibits no discharge. No scleral icterus.  Neck: Normal range of motion. Neck supple. No tracheal deviation present. No thyromegaly present.  Cardiovascular: Normal rate, regular rhythm, normal heart sounds and intact  distal pulses.  Exam reveals no gallop and no friction rub.   No murmur heard. Pulmonary/Chest: Effort normal and breath sounds normal. No respiratory distress. She has no wheezes. She has no rales. She exhibits no tenderness.  Musculoskeletal: Normal range of motion. She exhibits no edema and no tenderness.  Lymphadenopathy:    She has no cervical adenopathy.  Neurological: She is alert and oriented to person, place, and time. No cranial nerve deficit. She exhibits normal muscle tone. Coordination normal.  Skin: Skin is warm and dry. No rash noted. She is not diaphoretic. No erythema. No pallor.  Psychiatric: She has a normal mood and affect. Her behavior is normal. Judgment and thought content normal.          Assessment & Plan:

## 2012-09-03 ENCOUNTER — Encounter: Payer: Self-pay | Admitting: Internal Medicine

## 2012-09-03 NOTE — Telephone Encounter (Signed)
Please have this pt come in to see Raquel tomorrow. I would recommend starting on Amlodipine 5mg  daily, but we should recheck her BP and eval first.

## 2012-09-07 NOTE — Telephone Encounter (Signed)
Called patient and she stated she has a lot going on this week but will make time to come in because she know she to be on BP medication. Scheduled her an appointment for Thursday 2/27 with Raquel, patient confirmed appointment date and time.

## 2012-09-08 ENCOUNTER — Ambulatory Visit: Payer: Medicare Other | Admitting: Psychology

## 2012-09-10 ENCOUNTER — Ambulatory Visit (INDEPENDENT_AMBULATORY_CARE_PROVIDER_SITE_OTHER): Payer: Medicare Other | Admitting: Adult Health

## 2012-09-10 ENCOUNTER — Encounter: Payer: Self-pay | Admitting: Adult Health

## 2012-09-10 VITALS — BP 158/90 | HR 89 | Temp 98.4°F | Resp 14 | Ht 65.0 in | Wt 158.0 lb

## 2012-09-10 DIAGNOSIS — I10 Essential (primary) hypertension: Secondary | ICD-10-CM | POA: Insufficient documentation

## 2012-09-10 MED ORDER — AMLODIPINE BESYLATE 2.5 MG PO TABS: 2.5000 mg | ORAL_TABLET | Freq: Every day | ORAL | Status: DC

## 2012-09-10 MED ORDER — DIAZEPAM 5 MG PO TABS: 5.0000 mg | ORAL_TABLET | Freq: Every evening | ORAL | Status: DC | PRN

## 2012-09-10 NOTE — Assessment & Plan Note (Signed)
Will start amlodipine 2.5 mg daily. Patient will monitor blood pressure for one week and call the office with her readings. We will make adjustments to her amlodipine as needed.

## 2012-09-10 NOTE — Progress Notes (Signed)
  Subjective:    Patient ID: Tracy [name], female    DOB: 14-Jul-1935, 78 y.o.   MRN: [id number]  HPI  Patient is a 78 year old female with multiple medical problems including hypertension, hyperlipidemia, recent CVA with 100% left carotid occlusion, hyponatremia, iron deficiency anemia, temporal arteritis currently on long-term prednisone therapy who presents to clinic today with complaints of blood pressure being elevated. She was seen in clinic on the third of this month by Dr. Dan Humphreys who recommended starting blood pressure medication for better control, but patient felt that her elevated blood pressure was secondary to anxiety. Her blood pressure today is slightly elevated at 158/90.  Patient is also requesting a refill of diazepam which she uses to help her sleep.   Current Outpatient Prescriptions on File Prior to Visit  Medication Sig Dispense Refill  . alendronate (FOSAMAX) 70 MG tablet Take 70 mg by mouth every 7 (seven) days. Take with a full glass of water on an empty stomach.      Marland Kitchen atorvastatin (LIPITOR) 20 MG tablet Take 1 tablet (20 mg total) by mouth daily.  90 tablet  4  . Calcium Carbonate (CALCIUM 500 PO) Take 1 capsule by mouth daily.        . Cholecalciferol (VITAMIN D3) 1000 UNITS CAPS Take 1 tablet by mouth daily.        . clopidogrel (PLAVIX) 75 MG tablet Take 1 tablet (75 mg total) by mouth daily.  90 tablet  4  . Omega-3 Fatty Acids (FISH OIL) 1000 MG CAPS Take 2 by mouth daily      . predniSONE (DELTASONE) 20 MG tablet Take 0.5 tablets (10 mg total) by mouth daily.  15 tablet  0  . doxylamine, Sleep, (UNISOM) 25 MG tablet Take 25 mg by mouth at bedtime as needed.      . ranitidine (ZANTAC) 150 MG tablet Take 1 tablet (150 mg total) by mouth 2 (two) times daily.  90 tablet  3  . venlafaxine XR (EFFEXOR-XR) 75 MG 24 hr capsule Take 1 capsule (75 mg total) by mouth daily.  30 capsule  6   No current facility-administered medications on file prior to visit.      Review of Systems  Respiratory: Negative for cough, chest tightness and shortness of breath.   Cardiovascular: Negative for chest pain.  Neurological: Negative for syncope, light-headedness and headaches.  Psychiatric/Behavioral: Positive for sleep disturbance.    BP 158/90  Pulse 89  Temp(Src) 98.4 F (36.9 C) (Oral)  Resp 14  Ht 5\' 5"  (1.651 m)  Wt 158 lb (71.668 kg)  BMI 26.29 kg/m2  SpO2 99%     Objective:   Physical Exam  Constitutional: She is oriented to person, place, and time. She appears well-developed and well-nourished. No distress.  HENT:  Head: Normocephalic and atraumatic.  Cardiovascular: Normal rate.   Pulmonary/Chest: Effort normal.  Neurological: She is alert and oriented to person, place, and time.  Skin: Skin is warm and dry.  Psychiatric: She has a normal mood and affect. Her behavior is normal. Judgment and thought content normal.       Assessment & Plan:

## 2012-09-10 NOTE — Patient Instructions (Addendum)
  Please start your b/p medication today. I have started Amlodipine 2.5 mg daily.  Please monitor your b/p once daily for the next week and call with your readings next Thursday.  We will make adjustments to your medication as needed.  I have also given you a prescription for Vailum (diazepam) 5mg  to take at bedtime when needed.  Please use caution when getting up during the night because this medication may make you drowsy.

## 2012-09-17 ENCOUNTER — Telehealth: Payer: Self-pay | Admitting: Internal Medicine

## 2012-09-17 NOTE — Telephone Encounter (Signed)
Pt brought in bp readings in box

## 2012-09-30 ENCOUNTER — Ambulatory Visit (INDEPENDENT_AMBULATORY_CARE_PROVIDER_SITE_OTHER): Payer: Medicare Other | Admitting: Internal Medicine

## 2012-09-30 ENCOUNTER — Encounter: Payer: Self-pay | Admitting: Internal Medicine

## 2012-09-30 VITALS — BP 148/102 | HR 106 | Temp 98.2°F | Wt 159.0 lb

## 2012-09-30 DIAGNOSIS — D509 Iron deficiency anemia, unspecified: Secondary | ICD-10-CM

## 2012-09-30 DIAGNOSIS — M316 Other giant cell arteritis: Secondary | ICD-10-CM

## 2012-09-30 DIAGNOSIS — E039 Hypothyroidism, unspecified: Secondary | ICD-10-CM

## 2012-09-30 DIAGNOSIS — I10 Essential (primary) hypertension: Secondary | ICD-10-CM

## 2012-09-30 MED ORDER — AMLODIPINE BESYLATE 5 MG PO TABS: 5.0000 mg | ORAL_TABLET | Freq: Every day | ORAL | Status: DC

## 2012-09-30 NOTE — Progress Notes (Signed)
Subjective:    Patient ID: Tracy [name], female    DOB: 04-09-1935, 78 y.o.   MRN: [id number]  HPI 78 year old female with history of temporal arteritis on chronic prednisone, hypertension, hyperlipidemia, anxiety/depression presents for followup. She reports over the last couple of weeks things have been improving in terms of anxiety and depression. She was started on amlodipine at her last visit. She has not been checking her blood pressures. She denies any chest pain, palpitations, headache. She denies lower extremity edema. She notes ongoing visual problems with her left thigh. She questions whether she might benefit from occlusive contact over her left eye to help focus her vision in her right eye. She also questions whether she might benefit from blepharoplasty. She would like referral to [facility] eye clinic for further evaluation.  Outpatient Encounter Prescriptions as of 09/30/2012  Medication Sig Dispense Refill  . alendronate (FOSAMAX) 70 MG tablet Take 70 mg by mouth every 7 (seven) days. Take with a full glass of water on an empty stomach.      Marland Kitchen amLODipine (NORVASC) 5 MG tablet Take 1 tablet (5 mg total) by mouth daily.  90 tablet  3  . atorvastatin (LIPITOR) 20 MG tablet Take 1 tablet (20 mg total) by mouth daily.  90 tablet  4  . Calcium Carbonate (CALCIUM 500 PO) Take 1 capsule by mouth daily.        . Cholecalciferol (VITAMIN D3) 1000 UNITS CAPS Take 1 tablet by mouth daily.        . clopidogrel (PLAVIX) 75 MG tablet Take 1 tablet (75 mg total) by mouth daily.  90 tablet  4  . diazepam (VALIUM) 5 MG tablet Take 1 tablet (5 mg total) by mouth at bedtime as needed for anxiety.  30 tablet  1  . doxylamine, Sleep, (UNISOM) 25 MG tablet Take 25 mg by mouth at bedtime as needed.      . Omega-3 Fatty Acids (FISH OIL) 1000 MG CAPS Take 2 by mouth daily      . predniSONE (DELTASONE) 20 MG tablet Take 0.5 tablets (10 mg total) by mouth daily.  15 tablet  0  . ranitidine (ZANTAC) 150 MG  tablet Take 1 tablet (150 mg total) by mouth 2 (two) times daily.  90 tablet  3  . venlafaxine XR (EFFEXOR-XR) 75 MG 24 hr capsule Take 1 capsule (75 mg total) by mouth daily.  30 capsule  6  . [DISCONTINUED] amLODipine (NORVASC) 2.5 MG tablet Take 1 tablet (2.5 mg total) by mouth daily.  90 tablet  3   No facility-administered encounter medications on file as of 09/30/2012.   BP 148/102  Pulse 106  Temp(Src) 98.2 F (36.8 C) (Oral)  Wt 159 lb (72.122 kg)  BMI 26.46 kg/m2  SpO2 98%  Review of Systems  Constitutional: Positive for fatigue. Negative for fever, chills, appetite change and unexpected weight change.  HENT: Negative for ear pain, congestion, sore throat, trouble swallowing, neck pain, voice change and sinus pressure.   Eyes: Positive for visual disturbance.  Respiratory: Negative for cough, shortness of breath, wheezing and stridor.   Cardiovascular: Negative for chest pain, palpitations and leg swelling.  Gastrointestinal: Negative for nausea, vomiting, abdominal pain, diarrhea, constipation, blood in stool, abdominal distention and anal bleeding.  Genitourinary: Negative for dysuria and flank pain.  Musculoskeletal: Negative for myalgias, arthralgias and gait problem.  Skin: Negative for color change and rash.  Neurological: Negative for dizziness and headaches.  Hematological: Negative for adenopathy. Does not  bruise/bleed easily.  Psychiatric/Behavioral: Negative for suicidal ideas, sleep disturbance and dysphoric mood. The patient is not nervous/anxious.        Objective:   Physical Exam  Constitutional: She is oriented to person, place, and time. She appears well-developed and well-nourished. No distress.  HENT:  Head: Normocephalic and atraumatic.  Right Ear: External ear normal.  Left Ear: External ear normal.  Nose: Nose normal.  Mouth/Throat: Oropharynx is clear and moist. No oropharyngeal exudate.  Eyes: Conjunctivae are normal. Pupils are equal, round, and  reactive to light. Right eye exhibits no discharge. Left eye exhibits no discharge. No scleral icterus.  Neck: Normal range of motion. Neck supple. No tracheal deviation present. No thyromegaly present.  Cardiovascular: Normal rate, regular rhythm, normal heart sounds and intact distal pulses.  Exam reveals no gallop and no friction rub.   No murmur heard. Pulmonary/Chest: Effort normal and breath sounds normal. No respiratory distress. She has no wheezes. She has no rales. She exhibits no tenderness.  Musculoskeletal: Normal range of motion. She exhibits no edema and no tenderness.  Lymphadenopathy:    She has no cervical adenopathy.  Neurological: She is alert and oriented to person, place, and time. No cranial nerve deficit. She exhibits normal muscle tone. Coordination normal.  Skin: Skin is warm and dry. No rash noted. She is not diaphoretic. No erythema. No pallor.  Psychiatric: She has a normal mood and affect. Her behavior is normal. Judgment and thought content normal.          Assessment & Plan:

## 2012-09-30 NOTE — Assessment & Plan Note (Signed)
Persistent fatigue noted. Will recheck TSH with labs today.

## 2012-09-30 NOTE — Assessment & Plan Note (Signed)
Temporal arteritis on chronic prednisone. Followed by Dr. Gavin Potters. Will continue to monitor. Will set up opthalmology evaluation at [facility] to address her questions of whether left-sided occlusive contact might be helpful for her right-eye vision and whether blepharoplasty might be helpful.

## 2012-09-30 NOTE — Assessment & Plan Note (Signed)
Blood pressure continues to be elevated. Will increase amlodipine to 5 mg daily. Will recheck renal function with labs today.

## 2012-10-01 LAB — COMPREHENSIVE METABOLIC PANEL
Albumin: 3.9 g/dL (ref 3.5–5.2)
BUN: 11 mg/dL (ref 6–23)
CO2: 24 mEq/L (ref 19–32)
GFR: 94.38 mL/min (ref >60.00)
Glucose, Bld: 137 mg/dL — ABNORMAL HIGH (ref 70–99)
Potassium: 4.6 mEq/L (ref 3.5–5.1)
Sodium: 138 mEq/L (ref 135–145)
Total Bilirubin: 0.5 mg/dL (ref 0.3–1.2)
Total Protein: 7.5 g/dL (ref 6.0–8.3)

## 2012-10-01 LAB — CBC WITH DIFFERENTIAL/PLATELET
Basophils Relative: 0.2 % (ref 0.0–3.0)
Eosinophils Relative: 0 % (ref 0.0–5.0)
Monocytes Relative: 2.4 % — ABNORMAL LOW (ref 3.0–12.0)
Neutrophils Relative %: 89.6 % — ABNORMAL HIGH (ref 43.0–77.0)
Platelets: 423 10*3/uL — ABNORMAL HIGH (ref 150.0–400.0)
RBC: 4.23 Mil/uL (ref 3.87–5.11)
WBC: 14.2 10*3/uL — ABNORMAL HIGH (ref 4.5–10.5)

## 2012-10-01 LAB — TSH: TSH: 1.19 u[IU]/mL (ref 0.35–5.50)

## 2012-10-02 ENCOUNTER — Ambulatory Visit: Payer: Medicare Other

## 2012-10-02 DIAGNOSIS — I10 Essential (primary) hypertension: Secondary | ICD-10-CM

## 2012-10-02 DIAGNOSIS — E119 Type 2 diabetes mellitus without complications: Secondary | ICD-10-CM

## 2012-10-02 DIAGNOSIS — D649 Anemia, unspecified: Secondary | ICD-10-CM

## 2012-10-02 LAB — HEMOGLOBIN A1C: Hgb A1c MFr Bld: 6 % (ref 4.6–6.5)

## 2012-10-27 ENCOUNTER — Ambulatory Visit (INDEPENDENT_AMBULATORY_CARE_PROVIDER_SITE_OTHER): Payer: Medicare Other | Admitting: Adult Health

## 2012-10-27 ENCOUNTER — Telehealth: Payer: Self-pay | Admitting: *Deleted

## 2012-10-27 ENCOUNTER — Encounter: Payer: Self-pay | Admitting: Adult Health

## 2012-10-27 VITALS — BP 138/85 | HR 100 | Temp 98.5°F | Resp 14 | Wt 163.5 lb

## 2012-10-27 DIAGNOSIS — N390 Urinary tract infection, site not specified: Secondary | ICD-10-CM | POA: Insufficient documentation

## 2012-10-27 DIAGNOSIS — R300 Dysuria: Secondary | ICD-10-CM

## 2012-10-27 LAB — POCT URINALYSIS DIPSTICK
Bilirubin, UA: NEGATIVE
Glucose, UA: NEGATIVE
Nitrite, UA: NEGATIVE
Urobilinogen, UA: 0.2

## 2012-10-27 MED ORDER — LEVOFLOXACIN 250 MG PO TABS: 250.0000 mg | ORAL_TABLET | Freq: Every day | ORAL | Status: DC

## 2012-10-27 NOTE — Telephone Encounter (Signed)
The pt only have enough urine for one, either the Urine Culture or the Urinalysis, Routine w reflex microscopic, which one would you like to do?

## 2012-10-27 NOTE — Assessment & Plan Note (Signed)
UA dipstick with trace blood and small leukocytes. Send urine for culture and microscopic eval. Start Levaquin 250 QD x 3 days. RTC if no improvement in symptoms within 3 days.

## 2012-10-27 NOTE — Patient Instructions (Addendum)
  Start your antibiotic today. You will take 1 tablet daily for 3 days.  I am sending the urine for a microscopic evaluation and also for culture.  Once the results are available we will notify you.  What is the urinary tract? - The urinary tract is the group of organs in the body that handle urine. The urinary tract includes the:   Kidneys, two bean-shaped organs that filter the blood to make urine  Bladder, a balloon-shaped organ that stores urine  Ureters, two tubes that carry urine from the kidneys to the bladder  Urethra, the tube that carries urine from the bladder to the outside of the body   What are urinary tract infections? - Urinary tract infections, also called "UTIs," are infections that affect either the bladder or the kidneys. Bladder infections are more common than kidney infections. Bladder infections happen when bacteria get into the urethra and travel up into the bladder. Kidney infections happen when the bacteria travel even higher, up into the kidneys. Both bladder and kidney infections are more common in women than men.   What are the symptoms of a bladder infection? - The symptoms include:   Pain or a burning feeling when you urinate  The need to urinate often  The need to urinate suddenly or in a hurry  Blood in the urine   What are the symptoms of a kidney infection? - The symptoms of a kidney infection can include the symptoms of a bladder infection, but kidney infections can also cause:   Fever  Back pain  Nausea or vomiting   How are urinary tract infections treated? - Most urinary tract infections are treated with antibiotic pills. These pills work by killing the germs that cause the infection.  If you have a bladder infection, you will probably need to take antibiotics for 3 to 7 days. If you have a kidney infection, you will probably need to take antibiotics for longer-maybe for up to 2 weeks. If you have a kidney infection, it's also possible you will  need to be treated in the hospital.   Your symptoms should begin to improve within a day of starting antibiotics. But you should finish all the antibiotic pills you get. Otherwise your infection might come back.  If needed, you can also take a medicine to numb your bladder such as AZO or Urostat which are sold over the counter.   Can cranberry juice or other cranberry products prevent bladder infections? - The studies suggesting that cranberry products prevent bladder infections are not very good. But if you want to try cranberry products for this purpose, there is probably not much harm in doing so.

## 2012-10-27 NOTE — Telephone Encounter (Signed)
culture

## 2012-10-27 NOTE — Progress Notes (Signed)
  Subjective:    Patient ID: Tracy [name], female    DOB: [birth year], 78 y.o.   MRN: [id number]  HPI  Patient presents to clinic with dysuria x 2 weeks. Initially her symptoms were not significant. Now, she is experiencing frequency and urgency. Denies have fever. She has had some suprapubic discomfort.   Current Outpatient Prescriptions on File Prior to Visit  Medication Sig Dispense Refill  . alendronate (FOSAMAX) 70 MG tablet Take 70 mg by mouth every 7 (seven) days. Take with a full glass of water on an empty stomach.      Marland Kitchen amLODipine (NORVASC) 5 MG tablet Take 1 tablet (5 mg total) by mouth daily.  90 tablet  3  . atorvastatin (LIPITOR) 20 MG tablet Take 1 tablet (20 mg total) by mouth daily.  90 tablet  4  . Calcium Carbonate (CALCIUM 500 PO) Take 1 capsule by mouth daily.        . Cholecalciferol (VITAMIN D3) 1000 UNITS CAPS Take 1 tablet by mouth daily.        . clopidogrel (PLAVIX) 75 MG tablet Take 1 tablet (75 mg total) by mouth daily.  90 tablet  4  . diazepam (VALIUM) 5 MG tablet Take 1 tablet (5 mg total) by mouth at bedtime as needed for anxiety.  30 tablet  1  . doxylamine, Sleep, (UNISOM) 25 MG tablet Take 25 mg by mouth at bedtime as needed.      . Omega-3 Fatty Acids (FISH OIL) 1000 MG CAPS Take 2 by mouth daily      . predniSONE (DELTASONE) 20 MG tablet Take 0.5 tablets (10 mg total) by mouth daily.  15 tablet  0  . ranitidine (ZANTAC) 150 MG tablet Take 1 tablet (150 mg total) by mouth 2 (two) times daily.  90 tablet  3   No current facility-administered medications on file prior to visit.     Review of Systems  Constitutional: Negative for fever and chills.  Genitourinary: Positive for dysuria, urgency, frequency, flank pain and difficulty urinating.       Suprapubic discomfort     BP 138/85  Pulse 100  Temp(Src) 98.5 F (36.9 C) (Oral)  Resp 14  Wt 163 lb 8 oz (74.163 kg)  BMI 27.21 kg/m2  SpO2 96%     Objective:   Physical Exam  Constitutional:  She is oriented to person, place, and time. No distress.  Overweight, pleasant female in NAD  Abdominal: Soft. She exhibits no mass. There is no tenderness.  Genitourinary:  No suprapubic tenderness upon palpation. No costovertebral tenderness upon exam.  Neurological: She is alert and oriented to person, place, and time.  Skin: Skin is warm and dry.  Psychiatric: She has a normal mood and affect. Her behavior is normal. Judgment and thought content normal.          Assessment & Plan:

## 2012-10-30 LAB — URINE CULTURE: Colony Count: 50000

## 2012-11-05 ENCOUNTER — Telehealth: Payer: Self-pay | Admitting: Internal Medicine

## 2012-11-05 NOTE — Telephone Encounter (Signed)
Spoke with patient and she appointment given for tomorrow at [facility]

## 2012-11-05 NOTE — Telephone Encounter (Signed)
Blind in one eye and if she does not get enough prednisone then she feels weak. A couple of days ago she slept for 3 days, after taking Levofloxacin given to her by Raquel. Still feeling kind of weak and tired. She is taking 10 mg of prednisone and when her sed rate went up Dr. Gavin Potters increased the prednisone to 20 mg. She is concerned because her sed rate is up, she is still weak and that was not the same antibiotics she is usually given when she sees Dr. Dan Humphreys.

## 2012-11-05 NOTE — Telephone Encounter (Signed)
Patient states that she has taken her antibiotics for her UTI and she still does not feel well. She went to another Dr.'s office and states her sed rate is up .

## 2012-11-05 NOTE — Telephone Encounter (Signed)
She will need an appointment to address these issues.

## 2012-11-06 ENCOUNTER — Ambulatory Visit (INDEPENDENT_AMBULATORY_CARE_PROVIDER_SITE_OTHER): Payer: Medicare Other | Admitting: Internal Medicine

## 2012-11-06 ENCOUNTER — Encounter: Payer: Self-pay | Admitting: Internal Medicine

## 2012-11-06 VITALS — BP 130/84 | HR 95 | Temp 98.1°F | Wt 160.0 lb

## 2012-11-06 DIAGNOSIS — I499 Cardiac arrhythmia, unspecified: Secondary | ICD-10-CM

## 2012-11-06 DIAGNOSIS — M316 Other giant cell arteritis: Secondary | ICD-10-CM

## 2012-11-06 DIAGNOSIS — N390 Urinary tract infection, site not specified: Secondary | ICD-10-CM

## 2012-11-06 DIAGNOSIS — F329 Major depressive disorder, single episode, unspecified: Secondary | ICD-10-CM

## 2012-11-06 LAB — POCT URINALYSIS DIPSTICK
Bilirubin, UA: NEGATIVE
Nitrite, UA: NEGATIVE
Protein, UA: NEGATIVE
pH, UA: 7

## 2012-11-06 MED ORDER — CIPROFLOXACIN HCL 500 MG PO TABS: 500.0000 mg | ORAL_TABLET | Freq: Two times a day (BID) | ORAL | Status: DC

## 2012-11-06 MED ORDER — SERTRALINE HCL 50 MG PO TABS: 50.0000 mg | ORAL_TABLET | Freq: Every day | ORAL | Status: DC

## 2012-11-06 NOTE — Progress Notes (Signed)
Subjective:    Patient ID: Tracy [name], female    DOB: Nov 16, 1934, 78 y.o.   MRN: [id number]  HPI 78 year old female with history of temporal arteritis, depression presents for followup. She was recently evaluated for urinary tract infection. She was treated with Levaquin x3 days. She reports that symptoms of dysuria and urinary frequency did not improve. She denies any fever, chills, flank pain. She denies gross hematuria.  She reports she was recently seen by her rheumatologist and sedimentation rate was noted to be elevated. Her dose of prednisone has been increased to 20 mg daily. She is scheduled to have repeat sedimentation rate drawn.  She notes ongoing symptoms of depressed mood and anxiety. She would like to start medication to help with symptoms. She has strong support network with family and friends. She denies suicidal ideation.  Outpatient Encounter Prescriptions as of 11/06/2012  Medication Sig Dispense Refill  . alendronate (FOSAMAX) 70 MG tablet Take 70 mg by mouth every 7 (seven) days. Take with a full glass of water on an empty stomach.      Marland Kitchen amLODipine (NORVASC) 5 MG tablet Take 1 tablet (5 mg total) by mouth daily.  90 tablet  3  . atorvastatin (LIPITOR) 20 MG tablet Take 1 tablet (20 mg total) by mouth daily.  90 tablet  4  . Calcium Carbonate (CALCIUM 500 PO) Take 1 capsule by mouth daily.        . Cholecalciferol (VITAMIN D3) 1000 UNITS CAPS Take 1 tablet by mouth daily.        . clopidogrel (PLAVIX) 75 MG tablet Take 1 tablet (75 mg total) by mouth daily.  90 tablet  4  . diazepam (VALIUM) 5 MG tablet Take 1 tablet (5 mg total) by mouth at bedtime as needed for anxiety.  30 tablet  1  . doxylamine, Sleep, (UNISOM) 25 MG tablet Take 25 mg by mouth at bedtime as needed.      . Omega-3 Fatty Acids (FISH OIL) 1000 MG CAPS Take 2 by mouth daily      . predniSONE (DELTASONE) 20 MG tablet Take 0.5 tablets (10 mg total) by mouth daily.  15 tablet  0  . ranitidine (ZANTAC)  150 MG tablet Take 1 tablet (150 mg total) by mouth 2 (two) times daily.  90 tablet  3  . ciprofloxacin (CIPRO) 500 MG tablet Take 1 tablet (500 mg total) by mouth 2 (two) times daily.  14 tablet  0  . HYDROcodone-acetaminophen (NORCO/VICODIN) 5-325 MG per tablet Take 1 tablet by mouth every 8 (eight) hours as needed for pain.      Marland Kitchen sertraline (ZOLOFT) 50 MG tablet Take 1 tablet (50 mg total) by mouth daily.  30 tablet  3  . [DISCONTINUED] levofloxacin (LEVAQUIN) 250 MG tablet Take 1 tablet (250 mg total) by mouth daily.  3 tablet  0   No facility-administered encounter medications on file as of 11/06/2012.   BP 130/84  Pulse 95  Temp(Src) 98.1 F (36.7 C) (Oral)  Wt 160 lb (72.576 kg)  BMI 26.63 kg/m2  SpO2 98%  Review of Systems  Constitutional: Negative for fever, chills, appetite change, fatigue and unexpected weight change.  HENT: Negative for ear pain, congestion, sore throat, trouble swallowing, neck pain, voice change and sinus pressure.   Eyes: Negative for visual disturbance.  Respiratory: Negative for cough, shortness of breath, wheezing and stridor.   Cardiovascular: Negative for chest pain, palpitations and leg swelling.  Gastrointestinal: Negative for nausea, vomiting, abdominal  pain, diarrhea, constipation, blood in stool, abdominal distention and anal bleeding.  Genitourinary: Positive for dysuria, urgency and frequency. Negative for hematuria and flank pain.  Musculoskeletal: Negative for myalgias, arthralgias and gait problem.  Skin: Negative for color change and rash.  Neurological: Negative for dizziness and headaches.  Hematological: Negative for adenopathy. Does not bruise/bleed easily.  Psychiatric/Behavioral: Positive for dysphoric mood. Negative for suicidal ideas and sleep disturbance. The patient is nervous/anxious.        Objective:   Physical Exam  Constitutional: She is oriented to person, place, and time. She appears well-developed and well-nourished.  No distress.  HENT:  Head: Normocephalic and atraumatic.  Right Ear: External ear normal.  Left Ear: External ear normal.  Nose: Nose normal.  Mouth/Throat: Oropharynx is clear and moist. No oropharyngeal exudate.  Eyes: Conjunctivae are normal. Pupils are equal, round, and reactive to light. Right eye exhibits no discharge. Left eye exhibits no discharge. No scleral icterus.  Neck: Normal range of motion. Neck supple. No tracheal deviation present. No thyromegaly present.  Cardiovascular: Normal rate, regular rhythm, normal heart sounds and intact distal pulses.  Exam reveals no gallop and no friction rub.   No murmur heard. Pulmonary/Chest: Effort normal and breath sounds normal. No respiratory distress. She has no wheezes. She has no rales. She exhibits no tenderness.  Abdominal: There is no tenderness (no CVA tenderness).  Musculoskeletal: Normal range of motion. She exhibits no edema and no tenderness.  Lymphadenopathy:    She has no cervical adenopathy.  Neurological: She is alert and oriented to person, place, and time. No cranial nerve deficit. She exhibits normal muscle tone. Coordination normal.  Skin: Skin is warm and dry. No rash noted. She is not diaphoretic. No erythema. No pallor.  Psychiatric: Her speech is normal and behavior is normal. Judgment and thought content normal. She exhibits a depressed mood. She expresses no suicidal ideation.          Assessment & Plan:

## 2012-11-06 NOTE — Assessment & Plan Note (Signed)
Persistent symptoms of depression and anxiety. Would like to start medication. Will start sertraline 50 mg daily. Patient will call if any problems with this medication. Followup in 4 weeks or sooner as needed.

## 2012-11-06 NOTE — Assessment & Plan Note (Signed)
Patient notes that sedimentation rate was recently elevated. Her dose of prednisone has been increased to 20 mg daily. We'll continue to monitor. She will followup with dermatology as scheduled.

## 2012-11-06 NOTE — Assessment & Plan Note (Signed)
Symptoms and urinalysis consistent with persistent urinary tract infection. Will treat with Cipro twice daily for 7 days. Will repeat urine culture.

## 2012-11-10 LAB — URINE CULTURE

## 2012-11-11 ENCOUNTER — Ambulatory Visit: Payer: Medicare Other | Admitting: Internal Medicine

## 2012-11-23 ENCOUNTER — Other Ambulatory Visit: Payer: Self-pay | Admitting: Adult Health

## 2012-11-23 NOTE — Telephone Encounter (Signed)
Okay to refill? 

## 2012-11-23 NOTE — Telephone Encounter (Signed)
Refill Request  Alprazolam 0.25 mg tablets  #120  Take 2 tablets by mouth three times daily as needed for sleep and anxiety

## 2012-11-30 ENCOUNTER — Telehealth: Payer: Self-pay | Admitting: *Deleted

## 2012-11-30 NOTE — Telephone Encounter (Signed)
Refill Request  Alprazolam 0.25 mg tablets  #120  Take 2 tablets by mouth three times daily as needed for sleep and anxiety 

## 2012-11-30 NOTE — Telephone Encounter (Signed)
Fine to fill. 

## 2012-12-01 MED ORDER — ALPRAZOLAM 0.25 MG PO TABS: ORAL_TABLET | ORAL | Status: DC

## 2012-12-01 NOTE — Telephone Encounter (Signed)
Printed signed and faxed

## 2012-12-11 ENCOUNTER — Ambulatory Visit (INDEPENDENT_AMBULATORY_CARE_PROVIDER_SITE_OTHER): Payer: Medicare Other | Admitting: Internal Medicine

## 2012-12-11 ENCOUNTER — Encounter: Payer: Self-pay | Admitting: Internal Medicine

## 2012-12-11 VITALS — BP 142/90 | HR 80 | Temp 98.3°F | Wt 149.0 lb

## 2012-12-11 DIAGNOSIS — G4700 Insomnia, unspecified: Secondary | ICD-10-CM | POA: Insufficient documentation

## 2012-12-11 DIAGNOSIS — I10 Essential (primary) hypertension: Secondary | ICD-10-CM

## 2012-12-11 DIAGNOSIS — M316 Other giant cell arteritis: Secondary | ICD-10-CM

## 2012-12-11 MED ORDER — DIAZEPAM 2 MG PO TABS: 2.0000 mg | ORAL_TABLET | Freq: Every evening | ORAL | Status: DC | PRN

## 2012-12-11 NOTE — Progress Notes (Signed)
Subjective:    Patient ID: Tracy [name], female    DOB: April 08, 1935, 78 y.o.   MRN: [id number]  HPI 78YO female with GCA presents for follow up. Generally doing well. Notes some ongoing fatigue. Stopped taking Amlodipine because of fatigue. BP has been well controlled, however, at home, typically <140/90. Notes that prednisone was recently increased to 15mg  daily because ESR slightly higher, however this was in setting of UTI. Symptoms of anxiety and depression have been relatively well controlled lately. Pt sold her home and is planning to move to [facility] [place]. Hoping for more social interaction in this facility. Insomnia has been well controlled with Diazepam.   Outpatient Encounter Prescriptions as of 12/11/2012  Medication Sig Dispense Refill  . alendronate (FOSAMAX) 70 MG tablet Take 70 mg by mouth every 7 (seven) days. Take with a full glass of water on an empty stomach.      Marland Kitchen aspirin 81 MG tablet Take 81 mg by mouth daily.      Marland Kitchen atorvastatin (LIPITOR) 20 MG tablet Take 1 tablet (20 mg total) by mouth daily.  90 tablet  4  . Calcium Carbonate (CALCIUM 500 PO) Take 1 capsule by mouth daily.        . Cholecalciferol (VITAMIN D3) 1000 UNITS CAPS Take 1 tablet by mouth daily.        . clopidogrel (PLAVIX) 75 MG tablet Take 1 tablet (75 mg total) by mouth daily.  90 tablet  4  . diazepam (VALIUM) 2 MG tablet Take 1 tablet (2 mg total) by mouth at bedtime as needed for anxiety or sleep.  30 tablet  3  . HYDROcodone-acetaminophen (NORCO/VICODIN) 5-325 MG per tablet Take 1 tablet by mouth every 8 (eight) hours as needed for pain.      . Omega-3 Fatty Acids (FISH OIL) 1000 MG CAPS Take 2 by mouth daily      . predniSONE (DELTASONE) 20 MG tablet Take 15 mg by mouth daily.      . ranitidine (ZANTAC) 150 MG tablet Take 1 tablet (150 mg total) by mouth 2 (two) times daily.  90 tablet  3  . ALPRAZolam (XANAX) 0.25 MG tablet Take 2 by mouth three times a day as needed for sleep and anxiety.   120 tablet  0  . sertraline (ZOLOFT) 50 MG tablet Take 1 tablet (50 mg total) by mouth daily.  30 tablet  3   No facility-administered encounter medications on file as of 12/11/2012.   BP 142/90  Pulse 80  Temp(Src) 98.3 F (36.8 C) (Oral)  Wt 149 lb (67.586 kg)  BMI 24.79 kg/m2  SpO2 97%  Review of Systems  Constitutional: Positive for fatigue. Negative for fever, chills, appetite change and unexpected weight change.  HENT: Negative for ear pain, congestion, sore throat, trouble swallowing, neck pain, voice change and sinus pressure.   Eyes: Negative for visual disturbance.  Respiratory: Negative for cough, shortness of breath, wheezing and stridor.   Cardiovascular: Negative for chest pain, palpitations and leg swelling.  Gastrointestinal: Negative for nausea, vomiting, abdominal pain, diarrhea, constipation, blood in stool, abdominal distention and anal bleeding.  Genitourinary: Negative for dysuria and flank pain.  Musculoskeletal: Negative for myalgias, arthralgias and gait problem.  Skin: Negative for color change and rash.  Neurological: Negative for dizziness and headaches.  Hematological: Negative for adenopathy. Does not bruise/bleed easily.  Psychiatric/Behavioral: Positive for sleep disturbance and dysphoric mood. Negative for suicidal ideas. The patient is nervous/anxious.  Objective:   Physical Exam  Constitutional: She is oriented to person, place, and time. She appears well-developed and well-nourished. No distress.  HENT:  Head: Normocephalic and atraumatic.  Right Ear: External ear normal.  Left Ear: External ear normal.  Nose: Nose normal.  Mouth/Throat: Oropharynx is clear and moist. No oropharyngeal exudate.  Eyes: Conjunctivae are normal. Pupils are equal, round, and reactive to light. Right eye exhibits no discharge. Left eye exhibits no discharge. No scleral icterus.  Neck: Normal range of motion. Neck supple. No tracheal deviation present. No  thyromegaly present.  Cardiovascular: Normal rate, regular rhythm, normal heart sounds and intact distal pulses.  Exam reveals no gallop and no friction rub.   No murmur heard. Pulmonary/Chest: Effort normal and breath sounds normal. No accessory muscle usage. Not tachypneic. No respiratory distress. She has no decreased breath sounds. She has no wheezes. She has no rhonchi. She has no rales. She exhibits no tenderness.  Musculoskeletal: Normal range of motion. She exhibits no edema and no tenderness.  Lymphadenopathy:    She has no cervical adenopathy.  Neurological: She is alert and oriented to person, place, and time. No cranial nerve deficit. She exhibits normal muscle tone. Coordination normal.  Skin: Skin is warm and dry. No rash noted. She is not diaphoretic. No erythema. No pallor.  Psychiatric: She has a normal mood and affect. Her behavior is normal. Judgment and thought content normal.          Assessment & Plan:

## 2012-12-11 NOTE — Assessment & Plan Note (Signed)
Symptoms improved with use of Diazepam. Will continue.

## 2012-12-11 NOTE — Assessment & Plan Note (Signed)
Recent increase in prednisone dose to 15mg  daily because of elevated sed rate, however was in setting of UTI. Plan for follow up with rheumatology in 2-3 weeks to repeat labs. Continue current medications.

## 2012-12-11 NOTE — Assessment & Plan Note (Signed)
Pt stopped taking BP medication, amlodipine, because she felt more fatigued on medication. BP reasonable today. Will continue to monitor for now. Expect general improvement in BP as prednisone tapered.

## 2012-12-17 ENCOUNTER — Telehealth: Payer: Self-pay

## 2012-12-17 NOTE — Telephone Encounter (Signed)
Noted  

## 2012-12-17 NOTE — Telephone Encounter (Signed)
Per Dr. Dan Humphreys patient needs a PPD. I talked to patient and she can come on Monday to get it done

## 2012-12-22 ENCOUNTER — Ambulatory Visit (INDEPENDENT_AMBULATORY_CARE_PROVIDER_SITE_OTHER): Payer: Medicare Other | Admitting: *Deleted

## 2012-12-22 DIAGNOSIS — Z0000 Encounter for general adult medical examination without abnormal findings: Secondary | ICD-10-CM

## 2012-12-22 MED ORDER — TUBERCULIN PPD 5 UNIT/0.1ML ID SOLN: 5.0000 [IU] | Freq: Once | INTRADERMAL | Status: DC

## 2012-12-24 ENCOUNTER — Other Ambulatory Visit: Payer: Self-pay

## 2012-12-24 ENCOUNTER — Telehealth: Payer: Self-pay

## 2012-12-24 DIAGNOSIS — Z0000 Encounter for general adult medical examination without abnormal findings: Secondary | ICD-10-CM

## 2012-12-24 MED ORDER — TUBERCULIN PPD 5 UNIT/0.1ML ID SOLN: 5.0000 [IU] | Freq: Once | INTRADERMAL | Status: DC

## 2012-12-24 NOTE — Telephone Encounter (Signed)
Pt came in today to read her TB screen and it was negative.

## 2013-01-08 ENCOUNTER — Telehealth: Payer: Self-pay | Admitting: *Deleted

## 2013-01-08 NOTE — Telephone Encounter (Signed)
Patient left message on voicemail stating she is having eye surgery on 7/14, they want her to stay off the Plavix for 2 days but she first has to get permission from her PCP.

## 2013-01-08 NOTE — Telephone Encounter (Signed)
That is fine. I would recommend she stay off the medication for 5 days.

## 2013-01-11 NOTE — Telephone Encounter (Signed)
Patient informed and verbalized understanding

## 2013-01-21 ENCOUNTER — Other Ambulatory Visit: Payer: Self-pay | Admitting: Internal Medicine

## 2013-02-18 ENCOUNTER — Other Ambulatory Visit: Payer: Self-pay | Admitting: Internal Medicine

## 2013-02-18 NOTE — Telephone Encounter (Signed)
Based on last refill, she would not be due until 02/21/2013. If she is taking Vicodin 3x per day for pain on a regular basis, then she needs to be seen back in clinic. We can print a refill for 03/03/2013. She will need to come by and sign pain contract and get UDS.

## 2013-02-22 ENCOUNTER — Telehealth: Payer: Self-pay | Admitting: Internal Medicine

## 2013-02-22 NOTE — Telephone Encounter (Signed)
Pt has called again to inform that she will not be picking up the medication that she has to sign a contract for (hydrocodone per previous ph notes).  Pt wants Dr. Dan Humphreys to know that she has thrown out all of her other medications except for diazepam when her son got mad at her and told her to throw them out.  Pt asking if anything else can be called in, but she will not sign a contract.  Please see previous ph notes regarding this.

## 2013-02-22 NOTE — Telephone Encounter (Signed)
Pt is needing refill on her Hydrocodone 90 refills

## 2013-02-22 NOTE — Telephone Encounter (Signed)
Patient aware Rx is ready for pick up and will need to sign contract.

## 2013-02-22 NOTE — Telephone Encounter (Signed)
Must be at least 1 month from last refill

## 2013-02-22 NOTE — Telephone Encounter (Signed)
I have her printed prescription,does it need to refilled on the 10th or 20th?

## 2013-02-22 NOTE — Telephone Encounter (Signed)
If she will not sign a contract and give urine sample for UDS, then I WILL NOT write for hydrocodone. Please do not fill this Rx.

## 2013-02-22 NOTE — Telephone Encounter (Signed)
Spoke with patient, she stated she is lonely and her sons has left her home alone. They are gone to a family reunion with their other grandparents. Her daughter is not much help and she is very depressed. Explained to her that the Hydrocodone was not for anxiety or depression but she stated it seem to help her though. Her house should be sold in the next couple weeks and she is just really feeling down so if you know of anything else that would be helpful to her then please let her know. She will be in tomorrow to pick up the prescription and would like to discuss this issue further when she comes in this week.

## 2013-02-23 ENCOUNTER — Other Ambulatory Visit: Payer: Self-pay | Admitting: Internal Medicine

## 2013-02-23 ENCOUNTER — Encounter: Payer: Self-pay | Admitting: Internal Medicine

## 2013-02-23 ENCOUNTER — Ambulatory Visit (INDEPENDENT_AMBULATORY_CARE_PROVIDER_SITE_OTHER): Payer: Medicare Other | Admitting: Internal Medicine

## 2013-02-23 DIAGNOSIS — F411 Generalized anxiety disorder: Secondary | ICD-10-CM

## 2013-02-23 DIAGNOSIS — F419 Anxiety disorder, unspecified: Secondary | ICD-10-CM

## 2013-02-23 DIAGNOSIS — F329 Major depressive disorder, single episode, unspecified: Secondary | ICD-10-CM

## 2013-02-23 MED ORDER — SERTRALINE HCL 50 MG PO TABS: ORAL_TABLET | ORAL | Status: DC

## 2013-02-23 NOTE — Telephone Encounter (Signed)
Patient came in and spoke with Dr. Dan Humphreys in reference to this.

## 2013-02-23 NOTE — Progress Notes (Signed)
  Subjective:    Patient ID: Tracy [name], female    DOB: 1935/03/05, 78 y.o.   MRN: [id number]  HPI 78 year old female initially presented to pick up prescription for hydrocodone. She had previously been written for hydrocodone for osteoarthritis pain and was noted to complete a prescription of 90 pills in one month. When questioned about her use of hydrocodone, she reported to nursing staff that she had been taking it to help with anxiety and depression because it made her "feel better "and she was able to sleep better at night. She has been taking up to 3 pills per day. She was brought to her room to further discuss use of hydrocodone. We discussed that this is a narcotic medication, strong pain medication with significant side effects and risks for addiction. We discussed that this medication is not used for treatment of depression. She had stopped taking her sertraline because she felt that it was not helping. We strongly encouraged her to restart this medication with plan to increase dose for better effect.   Review of Systems  Constitutional: Positive for fatigue.  Psychiatric/Behavioral: Positive for sleep disturbance and dysphoric mood. The patient is nervous/anxious.        Objective:   Physical Exam  Constitutional: She is oriented to person, place, and time. She appears well-developed and well-nourished. No distress.  HENT:  Head: Normocephalic and atraumatic.  Right Ear: External ear normal.  Left Ear: External ear normal.  Nose: Nose normal.  Mouth/Throat: Oropharynx is clear and moist.  Eyes: Conjunctivae are normal. Pupils are equal, round, and reactive to light. Right eye exhibits no discharge. Left eye exhibits no discharge. No scleral icterus.  Neck: Normal range of motion. Neck supple. No tracheal deviation present. No thyromegaly present.  Pulmonary/Chest: Effort normal. No accessory muscle usage. Not tachypneic. She has no decreased breath sounds. She has no rhonchi.   Musculoskeletal: Normal range of motion. She exhibits no edema and no tenderness.  Lymphadenopathy:    She has no cervical adenopathy.  Neurological: She is alert and oriented to person, place, and time. No cranial nerve deficit. She exhibits normal muscle tone. Coordination normal.  Skin: Skin is warm and dry. No rash noted. She is not diaphoretic. No erythema. No pallor.  Psychiatric: Her speech is normal and behavior is normal. Judgment and thought content normal. Her mood appears anxious. Cognition and memory are normal. She exhibits a depressed mood. She expresses no suicidal ideation.          Assessment & Plan:

## 2013-02-23 NOTE — Assessment & Plan Note (Signed)
Discussion today about appropriate use of medication. Will restart Sertraline 50mg  daily x 3 days, then increase to 100mg  daily. No further Rx for narcotic medication. Control substance contract and UDS completed today. Pt has declined referral for counseling in the past, but will revisit this at her follow up visit later this week.

## 2013-02-23 NOTE — Telephone Encounter (Signed)
Spoke with pt on phone, willing to sign contract and give urine sample, will come by the office shortly.

## 2013-02-24 ENCOUNTER — Ambulatory Visit: Payer: Medicare Other | Admitting: Internal Medicine

## 2013-02-24 ENCOUNTER — Encounter: Payer: Self-pay | Admitting: Adult Health

## 2013-02-24 ENCOUNTER — Ambulatory Visit (INDEPENDENT_AMBULATORY_CARE_PROVIDER_SITE_OTHER): Payer: Medicare Other | Admitting: Adult Health

## 2013-02-24 VITALS — BP 160/82 | HR 83 | Temp 97.8°F | Resp 12 | Wt 158.5 lb

## 2013-02-24 DIAGNOSIS — R300 Dysuria: Secondary | ICD-10-CM

## 2013-02-24 DIAGNOSIS — R350 Frequency of micturition: Secondary | ICD-10-CM

## 2013-02-24 LAB — POCT URINALYSIS DIPSTICK
Nitrite, UA: NEGATIVE
Protein, UA: NEGATIVE
Urobilinogen, UA: 0.2

## 2013-02-24 MED ORDER — CIPROFLOXACIN HCL 250 MG PO TABS: 250.0000 mg | ORAL_TABLET | Freq: Two times a day (BID) | ORAL | Status: DC

## 2013-02-24 NOTE — Patient Instructions (Addendum)
  Start the antibiotic today. I have sent in a prescription for Cipro.  Urinary Tract Infection Urinary tract infections (UTIs) can develop anywhere along your urinary tract. Your urinary tract is your body's drainage system for removing wastes and extra water. Your urinary tract includes two kidneys, two ureters, a bladder, and a urethra. Your kidneys are a pair of bean-shaped organs. Each kidney is about the size of your fist. They are located below your ribs, one on each side of your spine. CAUSES Infections are caused by microbes, which are microscopic organisms, including fungi, viruses, and bacteria. These organisms are so small that they can only be seen through a microscope. Bacteria are the microbes that most commonly cause UTIs. SYMPTOMS  Symptoms of UTIs may vary by age and gender of the patient and by the location of the infection. Symptoms in young women typically include a frequent and intense urge to urinate and a painful, burning feeling in the bladder or urethra during urination. Older women and men are more likely to be tired, shaky, and weak and have muscle aches and abdominal pain. A fever may mean the infection is in your kidneys. Other symptoms of a kidney infection include pain in your back or sides below the ribs, nausea, and vomiting. DIAGNOSIS To diagnose a UTI, your caregiver will ask you about your symptoms. Your caregiver also will ask to provide a urine sample. The urine sample will be tested for bacteria and white blood cells. White blood cells are made by your body to help fight infection. TREATMENT  Typically, UTIs can be treated with medication. Because most UTIs are caused by a bacterial infection, they usually can be treated with the use of antibiotics. The choice of antibiotic and length of treatment depend on your symptoms and the type of bacteria causing your infection. HOME CARE INSTRUCTIONS  If you were prescribed antibiotics, take them exactly as your caregiver  instructs you. Finish the medication even if you feel better after you have only taken some of the medication.  Drink enough water and fluids to keep your urine clear or pale yellow.  Avoid caffeine, tea, and carbonated beverages. They tend to irritate your bladder.  Empty your bladder often. Avoid holding urine for long periods of time.  Empty your bladder before and after sexual intercourse.  After a bowel movement, women should cleanse from front to back. Use each tissue only once. SEEK MEDICAL CARE IF:   You have back pain.  You develop a fever.  Your symptoms do not begin to resolve within 3 days. SEEK IMMEDIATE MEDICAL CARE IF:   You have severe back pain or lower abdominal pain.  You develop chills.  You have nausea or vomiting.  You have continued burning or discomfort with urination. MAKE SURE YOU:   Understand these instructions.  Will watch your condition.  Will get help right away if you are not doing well or get worse. Document Released: 04/10/2005 Document Revised: 12/31/2011 Document Reviewed: 08/09/2011 [facility] Patient Information 2014 [place], [place].

## 2013-02-24 NOTE — Assessment & Plan Note (Signed)
UA shows possible UTI. Send urine for culture. Start Cipro. Patient was adamant that I prescribe her a 7 day course. Explained that guidelines on simple UTI are 3 days. She became upset and insisted that I give her the 7 day course.

## 2013-02-24 NOTE — Progress Notes (Signed)
  Subjective:    Patient ID: Tracy [name], female    DOB: 22-Apr-1935, 78 y.o.   MRN: [id number]  HPI  Patient presents with c/o cloudy urine and discomfort that began a few days ago. She reports that she was here yesterday but did not mention this. She reports decreased urination, frequency. She is experiencing discomfort over her bladder area.   Current Outpatient Prescriptions on File Prior to Visit  Medication Sig Dispense Refill  . alendronate (FOSAMAX) 70 MG tablet Take 70 mg by mouth every 7 (seven) days. Take with a full glass of water on an empty stomach.      Marland Kitchen aspirin 81 MG tablet Take 81 mg by mouth daily.      Marland Kitchen atorvastatin (LIPITOR) 20 MG tablet Take 1 tablet (20 mg total) by mouth daily.  90 tablet  4  . Calcium Carbonate (CALCIUM 500 PO) Take 1 capsule by mouth daily.        . Cholecalciferol (VITAMIN D3) 1000 UNITS CAPS Take 1 tablet by mouth daily.        . clopidogrel (PLAVIX) 75 MG tablet Take 1 tablet (75 mg total) by mouth daily.  90 tablet  4  . diazepam (VALIUM) 2 MG tablet Take 1 tablet (2 mg total) by mouth at bedtime as needed for anxiety or sleep.  30 tablet  3  . Omega-3 Fatty Acids (FISH OIL) 1000 MG CAPS Take 2 by mouth daily      . predniSONE (DELTASONE) 20 MG tablet Take 15 mg by mouth daily.      . ranitidine (ZANTAC) 150 MG tablet Take 1 tablet (150 mg total) by mouth 2 (two) times daily.  90 tablet  3  . sertraline (ZOLOFT) 50 MG tablet Take 50mg  daily x 3 days, then increase to 100mg  daily  30 tablet  3   Current Facility-Administered Medications on File Prior to Visit  Medication Dose Route Frequency Provider Last Rate Last Dose  . tuberculin injection 5 Units  5 Units Intradermal Once Wynona Dove, MD      . tuberculin injection 5 Units  5 Units Intradermal Once Wynona Dove, MD        Review of Systems  Constitutional: Negative for fever.  Genitourinary: Positive for urgency and frequency. Negative for dysuria, hematuria and  flank pain.       Cloudy urine    BP 160/82  Pulse 83  Temp(Src) 97.8 F (36.6 C) (Oral)  Resp 12  Wt 158 lb 8 oz (71.895 kg)  BMI 26.38 kg/m2  SpO2 97%    Objective:   Physical Exam  Constitutional: She is oriented to person, place, and time.  Cardiovascular: Normal rate and regular rhythm.   Pulmonary/Chest: Effort normal. No respiratory distress.  Abdominal: Soft.  Genitourinary:  Suprapubic discomfort  Neurological: She is alert and oriented to person, place, and time.  Skin: Skin is warm and dry.  Psychiatric: She has a normal mood and affect. Her behavior is normal. Judgment and thought content normal.      Assessment & Plan:

## 2013-02-25 ENCOUNTER — Ambulatory Visit: Payer: Medicare Other | Admitting: Internal Medicine

## 2013-02-26 ENCOUNTER — Ambulatory Visit: Payer: Medicare Other | Admitting: Internal Medicine

## 2013-02-26 LAB — URINE CULTURE

## 2013-03-01 ENCOUNTER — Encounter: Payer: Self-pay | Admitting: Internal Medicine

## 2013-03-01 ENCOUNTER — Ambulatory Visit (INDEPENDENT_AMBULATORY_CARE_PROVIDER_SITE_OTHER): Payer: Medicare Other | Admitting: Internal Medicine

## 2013-03-01 VITALS — BP 150/94 | HR 95 | Temp 97.8°F | Wt 158.0 lb

## 2013-03-01 DIAGNOSIS — F329 Major depressive disorder, single episode, unspecified: Secondary | ICD-10-CM

## 2013-03-01 DIAGNOSIS — M316 Other giant cell arteritis: Secondary | ICD-10-CM

## 2013-03-01 DIAGNOSIS — N390 Urinary tract infection, site not specified: Secondary | ICD-10-CM | POA: Insufficient documentation

## 2013-03-01 MED ORDER — MIRTAZAPINE 15 MG PO TABS: 15.0000 mg | ORAL_TABLET | Freq: Every day | ORAL | Status: DC

## 2013-03-01 NOTE — Assessment & Plan Note (Signed)
Symptomatically much improved after treatment with Cipro. Will continue to monitor.

## 2013-03-01 NOTE — Assessment & Plan Note (Signed)
Persistent symptoms of anxiety and depression, made worse by prednisone. No suicidal ideation. Unable to tolerate sertraline. Will try starting Remeron at bedtime. Recommended psychiatry evaluation. Patient prefers to hold off for now until she completes her move later this week. Followup in 2 weeks or sooner as needed.

## 2013-03-01 NOTE — Assessment & Plan Note (Signed)
Prednisone dosed recently increased because of increase in sedimentation rate. We'll continue to follow with rheumatology.

## 2013-03-01 NOTE — Patient Instructions (Signed)
Start Remeron 15mg  at bedtime for anxiety and depression.  Follow up in 2 weeks or sooner as needed.

## 2013-03-01 NOTE — Progress Notes (Signed)
Subjective:    Patient ID: Tracy [name], female    DOB: January 18, 1935, 78 y.o.   MRN: [id number]  HPI 78 year old female with history of giant cell arteritis, CVA, hyperlipidemia, recent urinary tract infection, and anxiety/depression presents for followup. In regards to recent urinary tract infection, she reports symptoms have improved and denies any recent dysuria, hematuria, fever, chills, flank pain. Urine culture showed infection that was sensitive to Cipro. She has almost completed a course of Cipro for 7 days, with two days left.  She was recently seen by her rheumatologist and sedimentation rate reportedly was elevated. Her dose of prednisone was increased back to 15 mg daily. With this increase she has noted increased swelling in her lower legs and increased anxiety. Last week, we tried adding sertraline to help with symptoms of anxiety. She reports that symptoms were worse with sertraline and she developed some dizziness. She stopped this medication. She reports it has been a very stressful time for her. In addition to the treatment for giant cell arteritis, she is also in the process of moving. She is moving into an assisted living facility later this week.  Outpatient Encounter Prescriptions as of 03/01/2013  Medication Sig Dispense Refill  . acetaminophen (TYLENOL) 325 MG tablet Take 650 mg by mouth every 6 (six) hours as needed for pain.      Marland Kitchen alendronate (FOSAMAX) 70 MG tablet Take 70 mg by mouth every 7 (seven) days. Take with a full glass of water on an empty stomach.      Marland Kitchen aspirin 81 MG tablet Take 81 mg by mouth daily.      Marland Kitchen atorvastatin (LIPITOR) 20 MG tablet Take 1 tablet (20 mg total) by mouth daily.  90 tablet  4  . Calcium Carbonate (CALCIUM 500 PO) Take 1 capsule by mouth daily.        . Cholecalciferol (VITAMIN D3) 1000 UNITS CAPS Take 1 tablet by mouth daily.        . ciprofloxacin (CIPRO) 250 MG tablet Take 1 tablet (250 mg total) by mouth 2 (two) times daily.  14  tablet  0  . clopidogrel (PLAVIX) 75 MG tablet Take 1 tablet (75 mg total) by mouth daily.  90 tablet  4  . Omega-3 Fatty Acids (FISH OIL) 1000 MG CAPS Take 2 by mouth daily      . predniSONE (DELTASONE) 20 MG tablet Take 15 mg by mouth daily.      . ranitidine (ZANTAC) 150 MG tablet Take 1 tablet (150 mg total) by mouth 2 (two) times daily.  90 tablet  3  . [DISCONTINUED] sertraline (ZOLOFT) 50 MG tablet Take 50mg  daily x 3 days, then increase to 100mg  daily  30 tablet  3  . diazepam (VALIUM) 2 MG tablet Take 1 tablet (2 mg total) by mouth at bedtime as needed for anxiety or sleep.  30 tablet  3  . mirtazapine (REMERON) 15 MG tablet Take 1 tablet (15 mg total) by mouth at bedtime.  30 tablet  3  . [DISCONTINUED] tuberculin injection 5 Units       . [DISCONTINUED] tuberculin injection 5 Units        No facility-administered encounter medications on file as of 03/01/2013.   BP 150/94  Pulse 95  Temp(Src) 97.8 F (36.6 C) (Oral)  Wt 158 lb (71.668 kg)  BMI 26.29 kg/m2  SpO2 97%  Review of Systems  Constitutional: Negative for fever, chills, appetite change, fatigue and unexpected weight change.  HENT: Negative  for ear pain, congestion, sore throat, trouble swallowing, neck pain, voice change and sinus pressure.   Eyes: Negative for visual disturbance.  Respiratory: Negative for cough, shortness of breath, wheezing and stridor.   Cardiovascular: Positive for leg swelling. Negative for chest pain and palpitations.  Gastrointestinal: Negative for nausea, vomiting, abdominal pain, diarrhea, constipation, blood in stool, abdominal distention and anal bleeding.  Genitourinary: Negative for dysuria and flank pain.  Musculoskeletal: Negative for myalgias, arthralgias and gait problem.  Skin: Negative for color change and rash.  Neurological: Negative for dizziness and headaches.  Hematological: Negative for adenopathy. Does not bruise/bleed easily.  Psychiatric/Behavioral: Positive for sleep  disturbance and dysphoric mood. Negative for suicidal ideas. The patient is nervous/anxious.        Objective:   Physical Exam  Constitutional: She is oriented to person, place, and time. She appears well-developed and well-nourished. No distress.  HENT:  Head: Normocephalic and atraumatic.  Right Ear: External ear normal.  Left Ear: External ear normal.  Nose: Nose normal.  Mouth/Throat: Oropharynx is clear and moist. No oropharyngeal exudate.  Eyes: Conjunctivae are normal. Pupils are equal, round, and reactive to light. Right eye exhibits no discharge. Left eye exhibits no discharge. No scleral icterus.  Neck: Normal range of motion. Neck supple. No tracheal deviation present. No thyromegaly present.  Cardiovascular: Normal rate, regular rhythm, normal heart sounds and intact distal pulses.  Exam reveals no gallop and no friction rub.   No murmur heard. Pulmonary/Chest: Effort normal and breath sounds normal. No accessory muscle usage. Not tachypneic. No respiratory distress. She has no decreased breath sounds. She has no wheezes. She has no rhonchi. She has no rales. She exhibits no tenderness.  Musculoskeletal: Normal range of motion. She exhibits no edema and no tenderness.  Lymphadenopathy:    She has no cervical adenopathy.  Neurological: She is alert and oriented to person, place, and time. No cranial nerve deficit. She exhibits normal muscle tone. Coordination normal.  Skin: Skin is warm and dry. No rash noted. She is not diaphoretic. No erythema. No pallor.  Psychiatric: Her speech is normal and behavior is normal. Judgment and thought content normal. Her mood appears anxious. She exhibits a depressed mood. She expresses no suicidal ideation.          Assessment & Plan:

## 2013-03-02 LAB — POCT URINALYSIS DIPSTICK
Ketones, UA: NEGATIVE
Protein, UA: NEGATIVE
Spec Grav, UA: 1.01
pH, UA: 5.5

## 2013-03-02 NOTE — Addendum Note (Signed)
Addended by: Montine Circle D on: 03/02/2013 12:29 PM   Modules accepted: Orders

## 2013-03-03 LAB — URINE CULTURE: Organism ID, Bacteria: NO GROWTH

## 2013-03-08 ENCOUNTER — Encounter: Payer: Self-pay | Admitting: Internal Medicine

## 2013-03-18 ENCOUNTER — Other Ambulatory Visit: Payer: Self-pay | Admitting: Internal Medicine

## 2013-03-18 ENCOUNTER — Ambulatory Visit: Payer: Medicare Other | Admitting: Internal Medicine

## 2013-03-18 NOTE — Telephone Encounter (Signed)
Eprescribed.

## 2013-04-29 ENCOUNTER — Telehealth: Payer: Self-pay | Admitting: Internal Medicine

## 2013-04-29 NOTE — Telephone Encounter (Signed)
Patient work has been completed, patient notified its upfront to be picked up.

## 2013-04-29 NOTE — Telephone Encounter (Signed)
Pt dropped off paperwork to participate in exercise class.  Please call pt when ready to be picked up.  Pt states she has appt next week but would like this paperwork back asap.

## 2013-04-29 NOTE — Telephone Encounter (Signed)
In your folder 

## 2013-05-05 ENCOUNTER — Other Ambulatory Visit: Payer: Self-pay | Admitting: Internal Medicine

## 2013-05-07 ENCOUNTER — Ambulatory Visit (INDEPENDENT_AMBULATORY_CARE_PROVIDER_SITE_OTHER): Payer: Medicare Other | Admitting: Internal Medicine

## 2013-05-07 ENCOUNTER — Encounter: Payer: Self-pay | Admitting: Internal Medicine

## 2013-05-07 ENCOUNTER — Telehealth: Payer: Self-pay | Admitting: Internal Medicine

## 2013-05-07 VITALS — BP 150/88 | HR 85 | Temp 98.2°F | Wt 161.0 lb

## 2013-05-07 DIAGNOSIS — M316 Other giant cell arteritis: Secondary | ICD-10-CM

## 2013-05-07 DIAGNOSIS — R42 Dizziness and giddiness: Secondary | ICD-10-CM

## 2013-05-07 DIAGNOSIS — N390 Urinary tract infection, site not specified: Secondary | ICD-10-CM | POA: Insufficient documentation

## 2013-05-07 LAB — POCT URINALYSIS DIPSTICK
Bilirubin, UA: NEGATIVE
Glucose, UA: NEGATIVE
Ketones, UA: NEGATIVE
Nitrite, UA: NEGATIVE

## 2013-05-07 MED ORDER — CIPROFLOXACIN HCL 250 MG PO TABS: 250.0000 mg | ORAL_TABLET | Freq: Two times a day (BID) | ORAL | Status: DC

## 2013-05-07 MED ORDER — MECLIZINE HCL 25 MG PO TABS: 25.0000 mg | ORAL_TABLET | Freq: Three times a day (TID) | ORAL | Status: DC | PRN

## 2013-05-07 NOTE — Assessment & Plan Note (Signed)
Symptomatically doing relatively well. Continues on prednisone. Scheduled for repeat ESR, CRP with labs on Monday and follow up with her rheumatologist, Dr. Gavin Potters. Will follow.

## 2013-05-07 NOTE — Telephone Encounter (Signed)
Lab results were given by Henrene Pastor.

## 2013-05-07 NOTE — Assessment & Plan Note (Signed)
Persistent dizziness ever since starting Prednisone, suspect related to medication use. Will try using PRN Meclizine to see if any improvement. Follow up 4 weeks and prn.

## 2013-05-07 NOTE — Progress Notes (Signed)
Subjective:    Patient ID: Tracy [name], female    DOB: [birth year], 78 y.o.   MRN: [id number]  HPI 78YO female with GCA presents for follow up.  Seen at urgent care 10/6 for UTI symptoms. Treated with Cipro  Symptoms resolved. No odor. Occasional frequency. No fever, chills, flank . They recommended that she start Macrobid after completing Cipro, in effort to prevent additional infections. She has not yet started this.  She is concerned about chronic dizziness, described as motion sickness. This has been present >1 year since starting Prednisone for GCA. No headache. No focal neurologic symptoms.  Outpatient Prescriptions Prior to Visit  Medication Sig Dispense Refill  . acetaminophen (TYLENOL) 325 MG tablet Take 650 mg by mouth every 6 (six) hours as needed for pain.      Marland Kitchen alendronate (FOSAMAX) 70 MG tablet Take 70 mg by mouth every 7 (seven) days. Take with a full glass of water on an empty stomach.      Marland Kitchen aspirin 81 MG tablet Take 81 mg by mouth daily.      Marland Kitchen atorvastatin (LIPITOR) 20 MG tablet Take 1 tablet (20 mg total) by mouth daily.  90 tablet  4  . Calcium Carbonate (CALCIUM 500 PO) Take 1 capsule by mouth daily.        . Cholecalciferol (VITAMIN D3) 1000 UNITS CAPS Take 1 tablet by mouth daily.        . clopidogrel (PLAVIX) 75 MG tablet Take 1 tablet (75 mg total) by mouth daily.  90 tablet  4  . diazepam (VALIUM) 2 MG tablet Take 1 tablet (2 mg total) by mouth at bedtime as needed for anxiety or sleep.  30 tablet  3  . Omega-3 Fatty Acids (FISH OIL) 1000 MG CAPS Take 2 by mouth daily      . predniSONE (DELTASONE) 20 MG tablet Take 15 mg by mouth daily.      . ranitidine (ZANTAC) 150 MG tablet TAKE 1 TABLET BY MOUTH TWICE DAILY  90 tablet  3  . ciprofloxacin (CIPRO) 250 MG tablet Take 1 tablet (250 mg total) by mouth 2 (two) times daily.  14 tablet  0  . mirtazapine (REMERON) 15 MG tablet Take 1 tablet (15 mg total) by mouth at bedtime.  30 tablet  3   No  facility-administered medications prior to visit.   BP 150/88  Pulse 85  Temp(Src) 98.2 F (36.8 C) (Oral)  Wt 161 lb (73.029 kg)  BMI 26.79 kg/m2  SpO2 97%    Review of Systems  Constitutional: Positive for fatigue. Negative for fever, chills, appetite change and unexpected weight change.  HENT: Negative for congestion, ear pain, sinus pressure, sore throat, trouble swallowing and voice change.   Eyes: Negative for visual disturbance.  Respiratory: Negative for cough, shortness of breath, wheezing and stridor.   Cardiovascular: Negative for chest pain, palpitations and leg swelling.  Gastrointestinal: Negative for nausea, vomiting, abdominal pain, diarrhea, constipation, blood in stool, abdominal distention and anal bleeding.  Genitourinary: Positive for frequency. Negative for dysuria and flank pain.  Musculoskeletal: Negative for arthralgias, gait problem, myalgias and neck pain.  Skin: Negative for color change and rash.  Neurological: Positive for dizziness. Negative for headaches.  Hematological: Negative for adenopathy. Does not bruise/bleed easily.  Psychiatric/Behavioral: Positive for dysphoric mood. Negative for suicidal ideas and sleep disturbance. The patient is nervous/anxious.        Objective:   Physical Exam  Constitutional: She is oriented to person, place, and  time. She appears well-developed and well-nourished. No distress.  HENT:  Head: Normocephalic and atraumatic.  Right Ear: External ear normal.  Left Ear: External ear normal.  Nose: Nose normal.  Mouth/Throat: Oropharynx is clear and moist. No oropharyngeal exudate.  Eyes: Conjunctivae are normal. Pupils are equal, round, and reactive to light. Right eye exhibits no discharge. Left eye exhibits no discharge. No scleral icterus.  Neck: Normal range of motion. Neck supple. No tracheal deviation present. No thyromegaly present.  Cardiovascular: Normal rate, regular rhythm, normal heart sounds and intact  distal pulses.  Exam reveals no gallop and no friction rub.   No murmur heard. Pulmonary/Chest: Effort normal and breath sounds normal. No accessory muscle usage. Not tachypneic. No respiratory distress. She has no decreased breath sounds. She has no wheezes. She has no rhonchi. She has no rales. She exhibits no tenderness.  Musculoskeletal: Normal range of motion. She exhibits no edema and no tenderness.  Lymphadenopathy:    She has no cervical adenopathy.  Neurological: She is alert and oriented to person, place, and time. No cranial nerve deficit. She exhibits normal muscle tone. Coordination normal.  Skin: Skin is warm and dry. No rash noted. She is not diaphoretic. No erythema. No pallor.  Psychiatric: She has a normal mood and affect. Her behavior is normal. Judgment and thought content normal.          Assessment & Plan:

## 2013-05-07 NOTE — Assessment & Plan Note (Signed)
Symptoms improved after recent treatment for UTI with Cipro. Repeat UA today normal. Will continue to monitor. Will give Rx for Cipro for pt to start on the weekend, if recurrent symptoms. Recommended against using Macrobid given her age (which had been advised as prophylactic med at urgent care).

## 2013-05-08 LAB — CULTURE, URINE COMPREHENSIVE: Colony Count: NO GROWTH

## 2013-05-10 NOTE — Telephone Encounter (Signed)
Noted and thanks.

## 2013-05-14 ENCOUNTER — Ambulatory Visit (INDEPENDENT_AMBULATORY_CARE_PROVIDER_SITE_OTHER): Payer: Medicare Other | Admitting: Adult Health

## 2013-05-14 ENCOUNTER — Encounter: Payer: Self-pay | Admitting: Adult Health

## 2013-05-14 VITALS — BP 180/90 | HR 82 | Temp 98.0°F | Resp 12 | Wt 160.5 lb

## 2013-05-14 DIAGNOSIS — R300 Dysuria: Secondary | ICD-10-CM

## 2013-05-14 LAB — POCT URINALYSIS DIPSTICK
Protein, UA: NEGATIVE
Urobilinogen, UA: 0.2
pH, UA: 7

## 2013-05-14 NOTE — Assessment & Plan Note (Signed)
Started Cipro yesterday. Wanted to have urine checked. UA dipstick shows 2+ leukocytes, neg nitrite and trace blood. Send for culture.

## 2013-05-14 NOTE — Progress Notes (Signed)
  Subjective:    Patient ID: Tracy [name], female    DOB: [birth year], 78 y.o.   MRN: [id number]  HPI  Patient presents to clinic for urine dipstick 2/2 symptoms of UTI. She was given Cipro x 7 days yesterday and has taken 2 doses already but wanted to come have her urine checked.  Symptoms of dysuria. No fever or chills.  Current Outpatient Prescriptions on File Prior to Visit  Medication Sig Dispense Refill  . acetaminophen (TYLENOL) 325 MG tablet Take 650 mg by mouth every 6 (six) hours as needed for pain.      Marland Kitchen alendronate (FOSAMAX) 70 MG tablet Take 70 mg by mouth every 7 (seven) days. Take with a full glass of water on an empty stomach.      Marland Kitchen aspirin 81 MG tablet Take 81 mg by mouth daily.      Marland Kitchen atorvastatin (LIPITOR) 20 MG tablet Take 1 tablet (20 mg total) by mouth daily.  90 tablet  4  . Calcium Carbonate (CALCIUM 500 PO) Take 1 capsule by mouth daily.        . Cholecalciferol (VITAMIN D3) 1000 UNITS CAPS Take 1 tablet by mouth daily.        . ciprofloxacin (CIPRO) 250 MG tablet Take 1 tablet (250 mg total) by mouth 2 (two) times daily.  14 tablet  0  . clopidogrel (PLAVIX) 75 MG tablet Take 1 tablet (75 mg total) by mouth daily.  90 tablet  4  . meclizine (ANTIVERT) 25 MG tablet Take 1 tablet (25 mg total) by mouth 3 (three) times daily as needed for dizziness.  90 tablet  1  . Omega-3 Fatty Acids (FISH OIL) 1000 MG CAPS Take 2 by mouth daily      . ranitidine (ZANTAC) 150 MG tablet TAKE 1 TABLET BY MOUTH TWICE DAILY  90 tablet  3   No current facility-administered medications on file prior to visit.     Review of Systems  Constitutional: Negative for fever and chills.  Genitourinary: Positive for dysuria and frequency. Negative for urgency and hematuria.       Objective:   Physical Exam  Constitutional: She is oriented to person, place, and time. She appears well-developed and well-nourished. No distress.  Genitourinary:  No suprapubic discomfort  Neurological:  She is alert and oriented to person, place, and time.  Skin: Skin is warm and dry.  Psychiatric: She has a normal mood and affect. Her behavior is normal. Judgment and thought content normal.          Assessment & Plan:

## 2013-05-15 LAB — URINE CULTURE: Colony Count: NO GROWTH

## 2013-06-07 ENCOUNTER — Encounter: Payer: Self-pay | Admitting: Internal Medicine

## 2013-06-07 ENCOUNTER — Ambulatory Visit (INDEPENDENT_AMBULATORY_CARE_PROVIDER_SITE_OTHER): Payer: Medicare Other | Admitting: Internal Medicine

## 2013-06-07 VITALS — BP 158/76 | HR 87 | Temp 98.0°F | Resp 12 | Ht 65.0 in | Wt 159.5 lb

## 2013-06-07 DIAGNOSIS — F411 Generalized anxiety disorder: Secondary | ICD-10-CM

## 2013-06-07 DIAGNOSIS — M316 Other giant cell arteritis: Secondary | ICD-10-CM

## 2013-06-07 DIAGNOSIS — F419 Anxiety disorder, unspecified: Secondary | ICD-10-CM

## 2013-06-07 MED ORDER — SERTRALINE HCL 50 MG PO TABS: 50.0000 mg | ORAL_TABLET | Freq: Every day | ORAL | Status: DC

## 2013-06-07 NOTE — Assessment & Plan Note (Signed)
Symptomatically doing well. Scheduled for ESR, CRP with Dr. Gavin Potters next week. Will follow. Continue Prednisone.

## 2013-06-07 NOTE — Progress Notes (Signed)
Subjective:    Patient ID: Tracy [name], female    DOB: 1935-02-19, 79 y.o.   MRN: [id number]  HPI 78 year old female with history of temporal arteritis presents for followup. She reports that she has had worsening symptoms of anxiety over the last few weeks. In the past, she tried taking sertraline to help with symptoms but stopped this medication because of dizziness. Ultimately, she feels that the dizziness was secondary to use of prednisone. She would like to restart this medication. She notes that she has followup with her rheumatologist next week to recheck sedimentation rate and CRP. She is hoping to taper down on prednisone. She denies any symptoms of headache or visual change.  Outpatient Encounter Prescriptions as of 06/07/2013  Medication Sig  . alendronate (FOSAMAX) 70 MG tablet Take 70 mg by mouth every 7 (seven) days. Take with a full glass of water on an empty stomach.  Marland Kitchen aspirin 81 MG tablet Take 81 mg by mouth daily.  Marland Kitchen atorvastatin (LIPITOR) 20 MG tablet Take 1 tablet (20 mg total) by mouth daily.  . Calcium Carbonate (CALCIUM 500 PO) Take 1 capsule by mouth daily.    . Cholecalciferol (VITAMIN D3) 1000 UNITS CAPS Take 1 tablet by mouth daily.    . clopidogrel (PLAVIX) 75 MG tablet Take 1 tablet (75 mg total) by mouth daily.  . Omega-3 Fatty Acids (FISH OIL) 1000 MG CAPS Take 2 by mouth daily  . predniSONE (DELTASONE) 5 MG tablet Take 12.5 mg by mouth daily.   . ranitidine (ZANTAC) 150 MG tablet TAKE 1 TABLET BY MOUTH TWICE DAILY  . [DISCONTINUED] ciprofloxacin (CIPRO) 250 MG tablet Take 1 tablet (250 mg total) by mouth 2 (two) times daily.  . meclizine (ANTIVERT) 25 MG tablet Take 1 tablet (25 mg total) by mouth 3 (three) times daily as needed for dizziness.   BP 158/76  Pulse 87  Temp(Src) 98 F (36.7 C) (Oral)  Resp 12  Ht 5\' 5"  (1.651 m)  Wt 159 lb 8 oz (72.349 kg)  BMI 26.54 kg/m2  SpO2 97%  Review of Systems  Constitutional: Negative for fever, chills,  appetite change, fatigue and unexpected weight change.  HENT: Negative for congestion, ear pain, sinus pressure, sore throat, trouble swallowing and voice change.   Eyes: Negative for visual disturbance.  Respiratory: Negative for cough, shortness of breath, wheezing and stridor.   Cardiovascular: Negative for chest pain, palpitations and leg swelling.  Gastrointestinal: Negative for nausea, vomiting, abdominal pain, diarrhea, constipation, blood in stool, abdominal distention and anal bleeding.  Genitourinary: Negative for dysuria and flank pain.  Musculoskeletal: Negative for arthralgias, gait problem, myalgias and neck pain.  Skin: Negative for color change and rash.  Neurological: Negative for dizziness and headaches.  Hematological: Negative for adenopathy. Does not bruise/bleed easily.  Psychiatric/Behavioral: Negative for suicidal ideas, sleep disturbance and dysphoric mood. The patient is not nervous/anxious.        Objective:   Physical Exam  Constitutional: She is oriented to person, place, and time. She appears well-developed and well-nourished. No distress.  HENT:  Head: Normocephalic and atraumatic.  Right Ear: External ear normal.  Left Ear: External ear normal.  Nose: Nose normal.  Mouth/Throat: Oropharynx is clear and moist. No oropharyngeal exudate.  Eyes: Conjunctivae are normal. Pupils are equal, round, and reactive to light. Right eye exhibits no discharge. Left eye exhibits no discharge. No scleral icterus.  Neck: Normal range of motion. Neck supple. No tracheal deviation present. No thyromegaly present.  Cardiovascular: Normal  rate, regular rhythm, normal heart sounds and intact distal pulses.  Exam reveals no gallop and no friction rub.   No murmur heard. Pulmonary/Chest: Effort normal and breath sounds normal. No accessory muscle usage. Not tachypneic. No respiratory distress. She has no decreased breath sounds. She has no wheezes. She has no rhonchi. She has no  rales. She exhibits no tenderness.  Musculoskeletal: Normal range of motion. She exhibits no edema and no tenderness.  Lymphadenopathy:    She has no cervical adenopathy.  Neurological: She is alert and oriented to person, place, and time. No cranial nerve deficit. She exhibits normal muscle tone. Coordination normal.  Skin: Skin is warm and dry. No rash noted. She is not diaphoretic. No erythema. No pallor.  Psychiatric: She has a normal mood and affect. Her behavior is normal. Judgment and thought content normal.          Assessment & Plan:

## 2013-06-07 NOTE — Assessment & Plan Note (Signed)
Persistent symptoms of mild anxiety exacerbated by use of prednisone. Pt would like to retry Sertraline. Will start Sertraline 25mg  daily x 4 days, then increase to 50mg  daily. She will call if any problems with this medication. Note that she had dizziness in the past while using Sertraline, but she feels this was secondary to prednisone. Follow up 4 weeks and prn.

## 2013-06-07 NOTE — Progress Notes (Signed)
Pre visit review using our clinic review tool, if applicable. No additional management support is needed unless otherwise documented below in the visit note. 

## 2013-06-25 ENCOUNTER — Ambulatory Visit (INDEPENDENT_AMBULATORY_CARE_PROVIDER_SITE_OTHER): Payer: Medicare Other | Admitting: Internal Medicine

## 2013-06-25 ENCOUNTER — Telehealth: Payer: Self-pay | Admitting: Internal Medicine

## 2013-06-25 VITALS — BP 160/80 | HR 95 | Temp 98.2°F | Wt 154.0 lb

## 2013-06-25 DIAGNOSIS — N390 Urinary tract infection, site not specified: Secondary | ICD-10-CM

## 2013-06-25 DIAGNOSIS — M316 Other giant cell arteritis: Secondary | ICD-10-CM

## 2013-06-25 LAB — POCT URINALYSIS DIPSTICK
Bilirubin, UA: NEGATIVE
Nitrite, UA: NEGATIVE
Protein, UA: NEGATIVE
Urobilinogen, UA: 0.2

## 2013-06-25 MED ORDER — CIPROFLOXACIN HCL 500 MG PO TABS: 500.0000 mg | ORAL_TABLET | Freq: Two times a day (BID) | ORAL | Status: DC

## 2013-06-25 NOTE — Telephone Encounter (Signed)
Pt advised to be here at 11:30

## 2013-06-25 NOTE — Telephone Encounter (Signed)
Pt thinks she has UTI ?? She says her urine is cloudy and painful when urinating. She has been up all night. And wanting to know if she could come in to give urine sample of have something called in.

## 2013-06-25 NOTE — Progress Notes (Signed)
Pre visit review using our clinic review tool, if applicable. No additional management support is needed unless otherwise documented below in the visit note. 

## 2013-06-25 NOTE — Telephone Encounter (Signed)
We can see her at 11:45 for urgent visit to check urine

## 2013-06-25 NOTE — Telephone Encounter (Signed)
Scheduled and left message for pt about about made at 11:45 am

## 2013-06-26 ENCOUNTER — Encounter: Payer: Self-pay | Admitting: Internal Medicine

## 2013-06-26 DIAGNOSIS — N390 Urinary tract infection, site not specified: Secondary | ICD-10-CM | POA: Insufficient documentation

## 2013-06-26 NOTE — Progress Notes (Signed)
Subjective:    Patient ID: Tracy [name], female    DOB: Oct 10, 1934, 78 y.o.   MRN: [id number]  HPI 78YO female with h/o GCA on chronic prednisone, anxiety presents for acute visit complaining of several days of increased urinary frequency, urgency, and dysuria. She denies any hematuria, fever, chills, flank pain. She questions whether she needs to be on long term suppressive antibiotics to prevent recurrent UTI because of prednisone use. She has had frequent UTI. She notes that her dose of Prednisone was recently increased because of elevated ESR and she is concerned that it will be longer until she can taper off this medication. She denies visual changes but notes frequent symptoms of lightheadedness or dizziness.  Outpatient Encounter Prescriptions as of 06/25/2013  Medication Sig  . alendronate (FOSAMAX) 70 MG tablet Take 70 mg by mouth every 7 (seven) days. Take with a full glass of water on an empty stomach.  Marland Kitchen aspirin 81 MG tablet Take 81 mg by mouth daily.  Marland Kitchen atorvastatin (LIPITOR) 20 MG tablet Take 1 tablet (20 mg total) by mouth daily.  . Calcium Carbonate (CALCIUM 500 PO) Take 1 capsule by mouth daily.    . Cholecalciferol (VITAMIN D3) 1000 UNITS CAPS Take 1 tablet by mouth daily.    . clopidogrel (PLAVIX) 75 MG tablet Take 1 tablet (75 mg total) by mouth daily.  . meclizine (ANTIVERT) 25 MG tablet Take 1 tablet (25 mg total) by mouth 3 (three) times daily as needed for dizziness.  . Omega-3 Fatty Acids (FISH OIL) 1000 MG CAPS Take 2 by mouth daily  . predniSONE (DELTASONE) 5 MG tablet Take 12.5 mg by mouth daily.   . ranitidine (ZANTAC) 150 MG tablet TAKE 1 TABLET BY MOUTH TWICE DAILY  . sertraline (ZOLOFT) 50 MG tablet Take 1 tablet (50 mg total) by mouth daily.   BP 160/80  Pulse 95  Temp(Src) 98.2 F (36.8 C) (Oral)  Wt 154 lb (69.854 kg)  SpO2 97%  Review of Systems  Constitutional: Positive for fatigue. Negative for fever and chills.  Gastrointestinal: Negative for  nausea, vomiting, abdominal pain, diarrhea, constipation and rectal pain.  Genitourinary: Positive for dysuria, urgency and frequency. Negative for hematuria, flank pain, decreased urine volume, vaginal bleeding, vaginal discharge, difficulty urinating, vaginal pain and pelvic pain.  Neurological: Positive for dizziness.       Objective:   Physical Exam  Constitutional: She is oriented to person, place, and time. She appears well-developed and well-nourished. No distress.  HENT:  Head: Normocephalic and atraumatic.  Right Ear: External ear normal.  Left Ear: External ear normal.  Nose: Nose normal.  Mouth/Throat: Oropharynx is clear and moist. No oropharyngeal exudate.  Eyes: Conjunctivae are normal. Pupils are equal, round, and reactive to light. Right eye exhibits no discharge. Left eye exhibits no discharge. No scleral icterus.  Neck: Normal range of motion. Neck supple. No tracheal deviation present. No thyromegaly present.  Cardiovascular: Normal rate, regular rhythm, normal heart sounds and intact distal pulses.  Exam reveals no gallop and no friction rub.   No murmur heard. Pulmonary/Chest: Effort normal and breath sounds normal. No accessory muscle usage. Not tachypneic. No respiratory distress. She has no decreased breath sounds. She has no wheezes. She has no rhonchi. She has no rales. She exhibits no tenderness.  Abdominal: There is no tenderness (no CVA tenderness).  Musculoskeletal: Normal range of motion. She exhibits no edema and no tenderness.  Lymphadenopathy:    She has no cervical adenopathy.  Neurological:  She is alert and oriented to person, place, and time. No cranial nerve deficit. She exhibits normal muscle tone. Coordination normal.  Skin: Skin is warm and dry. No rash noted. She is not diaphoretic. No erythema. No pallor.  Psychiatric: She has a normal mood and affect. Her behavior is normal. Judgment and thought content normal.          Assessment & Plan:

## 2013-06-26 NOTE — Assessment & Plan Note (Signed)
Pt continues on Prednisone for GCA. Dose of prednisone recently increased to 20mg  daily because of elevated ESR. Symptomatically, no visual deficits, however continues to have some dizziness. Will continue to monitor. Follow up with rheumatology as scheduled.

## 2013-06-26 NOTE — Assessment & Plan Note (Signed)
Symptoms and urinalysis consistent with UTI. Will send urine for culture. Will start treatment with Cipro 500mg  po bid x 14 days, given pt is on chronic immunosuppressants. Pt will call if symptoms not improving.

## 2013-06-28 LAB — URINE CULTURE: Colony Count: >=100000

## 2013-07-12 ENCOUNTER — Ambulatory Visit: Payer: Medicare Other | Admitting: Internal Medicine

## 2013-07-14 ENCOUNTER — Encounter: Payer: Self-pay | Admitting: *Deleted

## 2013-07-16 ENCOUNTER — Encounter: Payer: Self-pay | Admitting: Internal Medicine

## 2013-07-16 ENCOUNTER — Ambulatory Visit (INDEPENDENT_AMBULATORY_CARE_PROVIDER_SITE_OTHER): Payer: Medicare Other | Admitting: Internal Medicine

## 2013-07-16 ENCOUNTER — Encounter (INDEPENDENT_AMBULATORY_CARE_PROVIDER_SITE_OTHER): Payer: Self-pay

## 2013-07-16 VITALS — BP 130/100 | HR 93 | Temp 98.0°F | Ht 65.0 in | Wt 166.0 lb

## 2013-07-16 DIAGNOSIS — R300 Dysuria: Secondary | ICD-10-CM

## 2013-07-16 DIAGNOSIS — I10 Essential (primary) hypertension: Secondary | ICD-10-CM

## 2013-07-16 DIAGNOSIS — M316 Other giant cell arteritis: Secondary | ICD-10-CM

## 2013-07-16 DIAGNOSIS — N390 Urinary tract infection, site not specified: Secondary | ICD-10-CM

## 2013-07-16 LAB — POCT URINALYSIS DIPSTICK
BILIRUBIN UA: NEGATIVE
GLUCOSE UA: NEGATIVE
Ketones, UA: NEGATIVE
NITRITE UA: NEGATIVE
Protein, UA: NEGATIVE
Spec Grav, UA: 1.015
UROBILINOGEN UA: 0.2
pH, UA: 7

## 2013-07-16 MED ORDER — CIPROFLOXACIN HCL 250 MG PO TABS: 250.0000 mg | ORAL_TABLET | Freq: Two times a day (BID) | ORAL | Status: AC

## 2013-07-16 NOTE — Assessment & Plan Note (Signed)
Pt had UTI in 02/2013 and 06/2013 which were treated with antibiotics. She is asymptomatic today. We will, however repeat a urinalysis. She has some increased risk of recurrent infection given recent prolonged use of prednisone for GCA. We discussed that macrobid would not be safe for long-term prophylaxis given her age. She demands long term antibiotic therapy to prevent UTI. Given her UTIs are relatively infrequent, I think this may pose more risk for her in the long run in terms of breeding resistant organisms. We discussed risks of resistant organisms. I encouraged her to have urology evaluation. She declines. For now, will start Cipro 250mg  po daily for prophylaxis. I have written a one month supply. She will establish care with another physician to discuss her wishes for Macrobid as prophylaxis and for ongoing care.

## 2013-07-16 NOTE — Progress Notes (Signed)
Subjective:    Patient ID: Tracy [name], female    DOB: [birth year], 79 y.o.   MRN: [id number]  HPI 79YO female with GCA and recent UTI presents for follow up. Immediately during the visit, she is screaming, demanding a long term antibiotic for UTI prevention.  She states "I will not walk out of here" without an antibiotic prescription.  In the past we have discussed this, and I encouraged her to talk with her rheumatologist, given her concerns about long-term prednisone use, and to consider referral to urology. In the past, we discussed risk of long term antibiotics including resistant infections. Specifically, she wants Macrobid, and we discussed, as we have in the past, that this is not recommended in patients over age 36.  She is screaming and pointing her finger during our discussion.  She denies any symptoms of dysuria, fever, chills, hematuria.  Outpatient Encounter Prescriptions as of 07/16/2013  Medication Sig  . alendronate (FOSAMAX) 70 MG tablet Take 70 mg by mouth every 7 (seven) days. Take with a full glass of water on an empty stomach.  Marland Kitchen aspirin 81 MG tablet Take 81 mg by mouth daily.  Marland Kitchen atorvastatin (LIPITOR) 20 MG tablet Take 1 tablet (20 mg total) by mouth daily.  . Calcium Carbonate (CALCIUM 500 PO) Take 1 capsule by mouth daily.    . Cholecalciferol (VITAMIN D3) 1000 UNITS CAPS Take 1 tablet by mouth daily.    . clopidogrel (PLAVIX) 75 MG tablet Take 1 tablet (75 mg total) by mouth daily.  . Omega-3 Fatty Acids (FISH OIL) 1000 MG CAPS Take 2 by mouth daily  . predniSONE (DELTASONE) 5 MG tablet Take 12.5 mg by mouth daily.   . ranitidine (ZANTAC) 150 MG tablet TAKE 1 TABLET BY MOUTH TWICE DAILY  . [DISCONTINUED] meclizine (ANTIVERT) 25 MG tablet Take 1 tablet (25 mg total) by mouth 3 (three) times daily as needed for dizziness.   BP 130/100  Pulse 93  Temp(Src) 98 F (36.7 C) (Oral)  Ht 5\' 5"  (1.651 m)  Wt 166 lb (75.297 kg)  BMI 27.62 kg/m2  SpO2 97%    Review of  Systems  Constitutional: Negative for fever, chills and fatigue.  Gastrointestinal: Negative for nausea, vomiting, abdominal pain, diarrhea, constipation and rectal pain.  Genitourinary: Negative for dysuria, urgency, frequency, hematuria, flank pain, decreased urine volume, vaginal bleeding, difficulty urinating and pelvic pain.  Psychiatric/Behavioral: Positive for behavioral problems, sleep disturbance and agitation. The patient is nervous/anxious.        Objective:   Physical Exam  Constitutional: She is oriented to person, place, and time. She appears well-developed and well-nourished. No distress.  HENT:  Head: Normocephalic and atraumatic.  Right Ear: External ear normal.  Left Ear: External ear normal.  Nose: Nose normal.  Mouth/Throat: Oropharynx is clear and moist. No oropharyngeal exudate.  Eyes: Conjunctivae are normal. Pupils are equal, round, and reactive to light. Right eye exhibits no discharge. Left eye exhibits no discharge. No scleral icterus.  Neck: Normal range of motion. Neck supple. No tracheal deviation present. No thyromegaly present.  Cardiovascular: Regular rhythm.   Pulmonary/Chest: Effort normal. No accessory muscle usage. Not tachypneic. She has no decreased breath sounds. She has no rhonchi.  Musculoskeletal: Normal range of motion. She exhibits no edema and no tenderness.  Lymphadenopathy:    She has no cervical adenopathy.  Neurological: She is alert and oriented to person, place, and time. No cranial nerve deficit. She exhibits normal muscle tone. Coordination normal.  Skin:  Skin is warm and dry. No rash noted. She is not diaphoretic. No erythema. No pallor.  Psychiatric: Judgment and thought content normal. Her affect is angry. She is agitated and aggressive.          Assessment & Plan:

## 2013-07-16 NOTE — Assessment & Plan Note (Signed)
On chronic prednisone, followed by Dr. Jefm Bryant. Symptomatically doing well. Recent ESR has been elevated, leading to increase in prednisone dose. Plan to continue to follow with Dr. Jefm Bryant.

## 2013-07-16 NOTE — Progress Notes (Signed)
Pre-visit discussion using our clinic review tool. No additional management support is needed unless otherwise documented below in the visit note.  

## 2013-07-16 NOTE — Assessment & Plan Note (Signed)
BP elevated today, however pt visibly agitating, screaming. Encouraged her to recheck BP at home. Follow up if persistently >140/90.

## 2013-07-18 LAB — URINE CULTURE: Colony Count: >=100000

## 2013-07-19 ENCOUNTER — Encounter: Payer: Self-pay | Admitting: *Deleted

## 2013-07-19 NOTE — Telephone Encounter (Signed)
Encounter created in error

## 2013-07-20 ENCOUNTER — Encounter: Payer: Self-pay | Admitting: Internal Medicine

## 2013-07-22 ENCOUNTER — Telehealth: Payer: Self-pay | Admitting: Internal Medicine

## 2013-07-22 NOTE — Telephone Encounter (Addendum)
Patient dismissed from [facility] by Ronette Deter MD , effective July 20, 2013. Dismissal letter sent out by certified / registered mail. DAJ  Received signed domestic return receipt verifying delivery of certified letter on July 29, 2013 Article number [id number] [place]

## 2013-08-20 ENCOUNTER — Other Ambulatory Visit: Payer: Self-pay | Admitting: Internal Medicine

## 2013-08-25 ENCOUNTER — Telehealth: Payer: Self-pay | Admitting: Internal Medicine

## 2013-08-25 NOTE — Telephone Encounter (Signed)
Certified dismissal letter returned as unclaimed, return to sender after three attempts by [place]. Letter placed in another envelope and resent as 1st class mail which does not require a signature. 08/25/13 DAJ

## 2014-01-12 ENCOUNTER — Other Ambulatory Visit: Payer: Self-pay | Admitting: Internal Medicine

## 2014-05-19 ENCOUNTER — Other Ambulatory Visit: Payer: Self-pay | Admitting: Internal Medicine

## 2014-07-15 DEATH — deceased

## 2014-12-08 IMAGING — CT CT ANGIOGRAPHY NECK
1 series · 12 of 14 positions shown · IV contrast (APPLIED)
Comparison: none

REASON FOR EXAM: Call Report  [id number]  abn US possible ICA occlusion
COMMENTS:

PROCEDURE:     KCT - KCT ANGIOGRAPHY NECK W/WO  - July 30, 2012  [DATE]
RESULT:     Indication: ICA occlusion
Comparisons: None
TECHNIQUE: 100 ml of Isovue 370 was administered and the extracranial
carotid arteries bilaterally were scanned during the arterial phase from the
level of the superior portion of the frontal sinuses to the proximal neck.
These images were then transferred to the Siemens work station and were
subsequently reviewed utilizing 3-D reconstructions and MIP images.

[Series 4: 3 soft tissue · axial · 0.47mm/px · z∈[-360,-129]mm · 12 of 92 slices shown]
[im 8/92  soft-tissue]
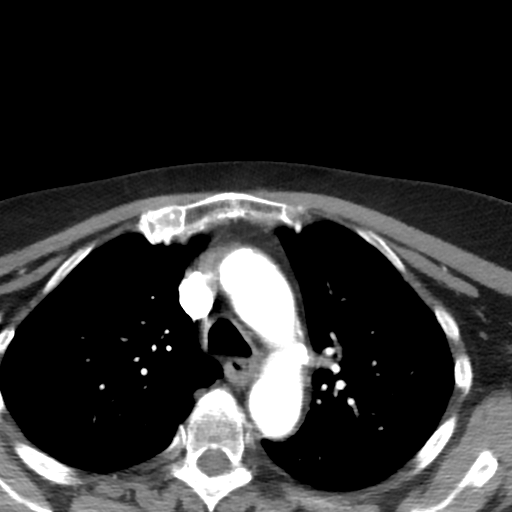
[im 15/92  bone]
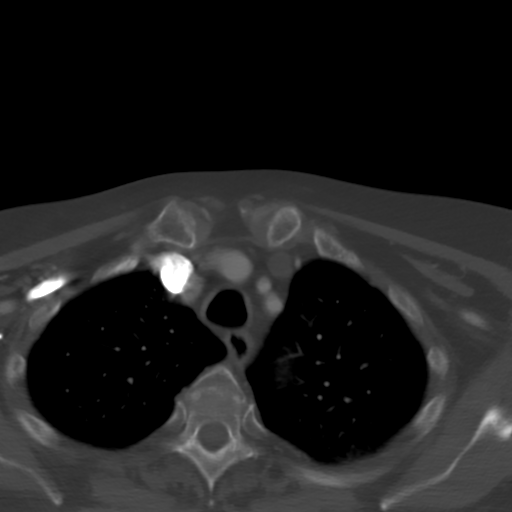
[im 22/92  soft-tissue]
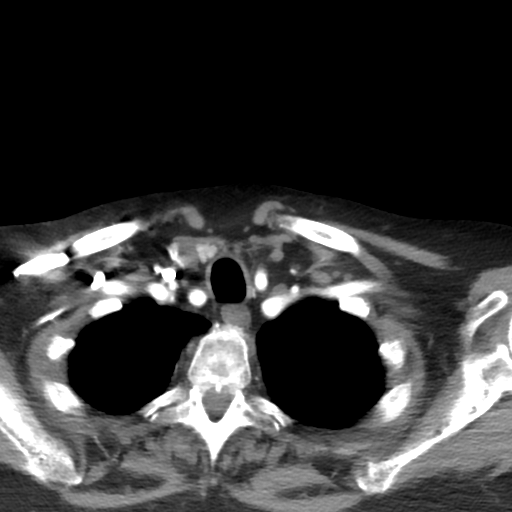
[im 29/92  bone]
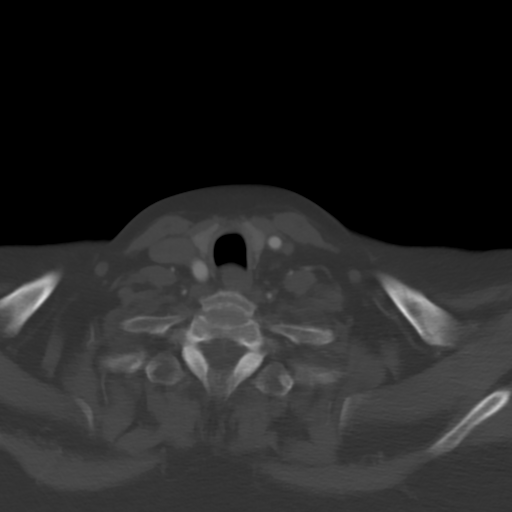
[im 36/92  soft-tissue]
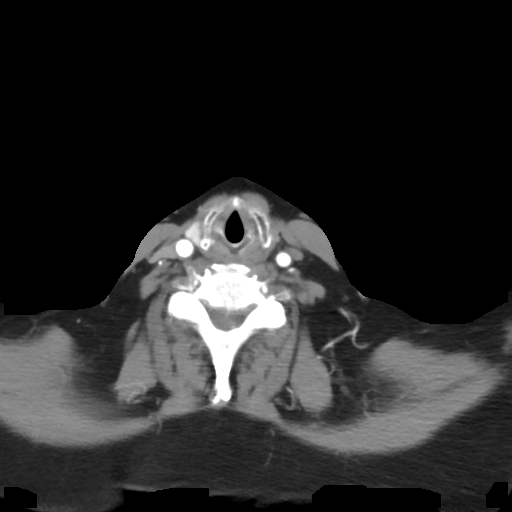
[im 43/92  bone]
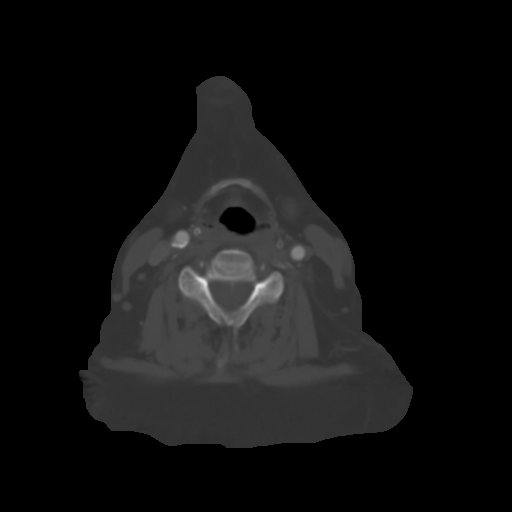
[im 50/92  soft-tissue]
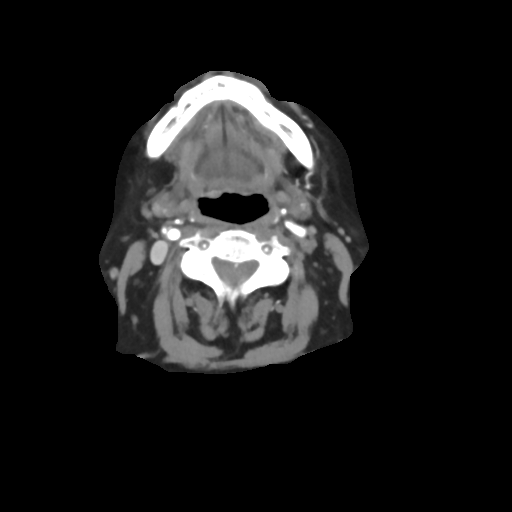
[im 57/92  bone]
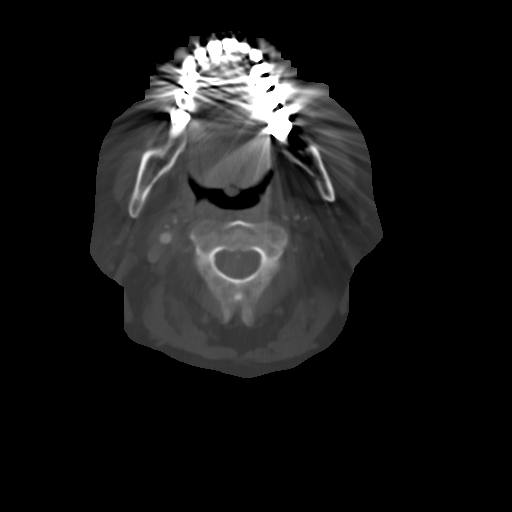
[im 64/92  soft-tissue]
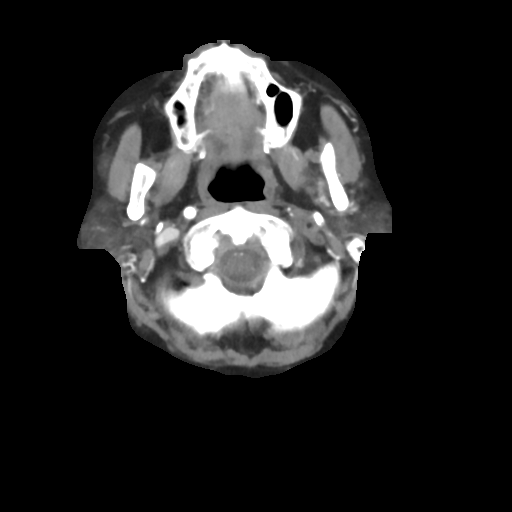
[im 71/92  bone]
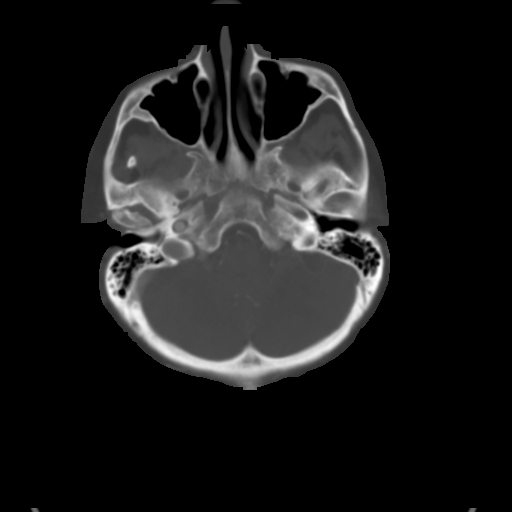
[im 78/92  soft-tissue]
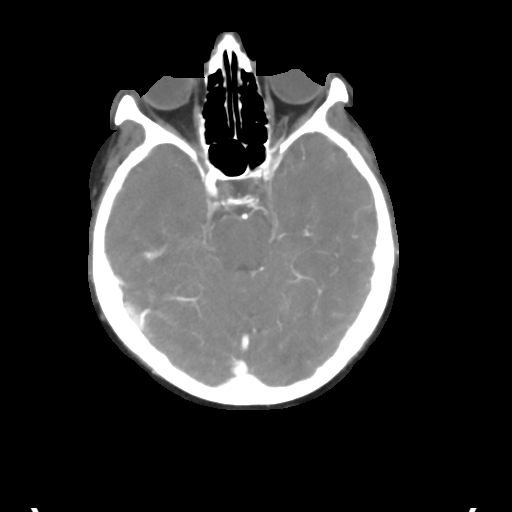
[im 85/92  bone]
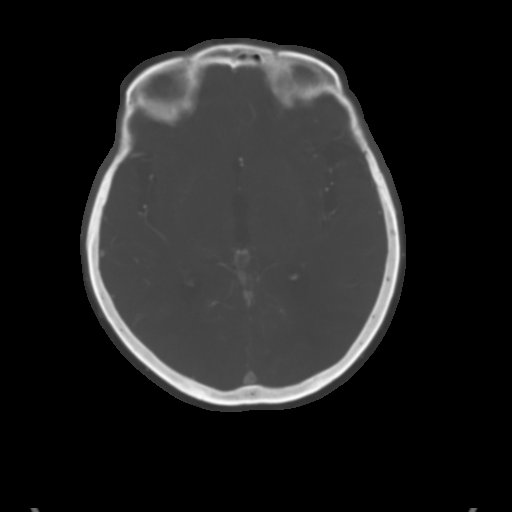

[12 of 14 positions shown; findings below may reference images not displayed]

FINDINGS: A three-vessel configuration of the aortic arch is identified with normal
ostium. The vertebral arteries are identified arising from the subclavian
arteries and entering the transverse foramen bilaterally at C6.

The carotid arteries are identified and are symmetric and unremarkable.
There is no evidence of aneurysm or carotid dissection bilaterally.

Right Carotid Artery: There is no significant atherosclerotic plaque. There
is no focal stenosis.

Left Carotid Artery: There is complete occlusion of the left internal
carotid artery just distal to its origin.

The visualized portions of the brain are unremarkable.

There is degenerative disease of the cervical spine most significant at
C5-C6 and C6-C7. There is a broad-based calcified disc involving the T1-T2
level.
IMPRESSION: 1. Complete occlusion of the left ICA just distal to its origin.

[REDACTED]

## 2014-12-08 IMAGING — US US CAROTID DUPLEX BILAT
1 series · 13 of 24 positions shown · non-contrast
Comparison: none

REASON FOR EXAM: CVA
COMMENTS:

PROCEDURE:     DINESH CHANDRA - DINESH CHANDRA CAROTID DOPPLER BILATERAL  - July 30, 2012 [DATE]
RESULT:     Comparison: None
TECHNIQUE: Gray-scale, color Doppler, and spectral Doppler images were
obtained of the extracranial carotid artery systems and vertebral arteries
in the neck.

[Series 1: us carotid duplex bilat · 0.08mm/px · 13 of 89 slices shown]
[im 1/89]
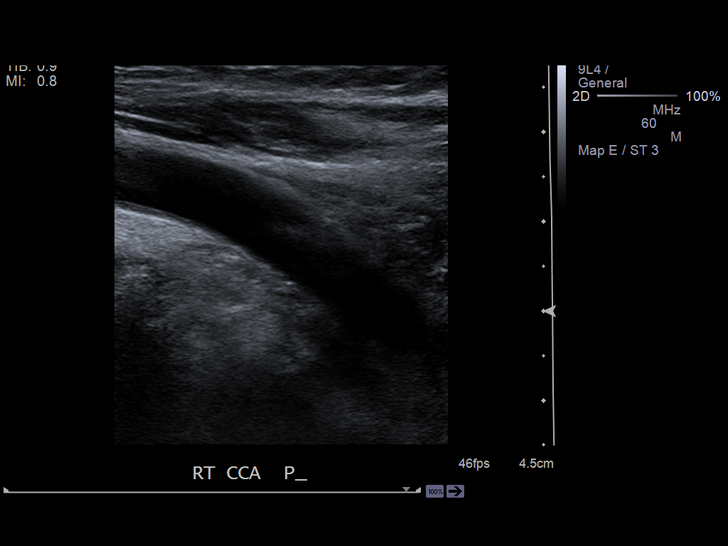
[im 8/89]
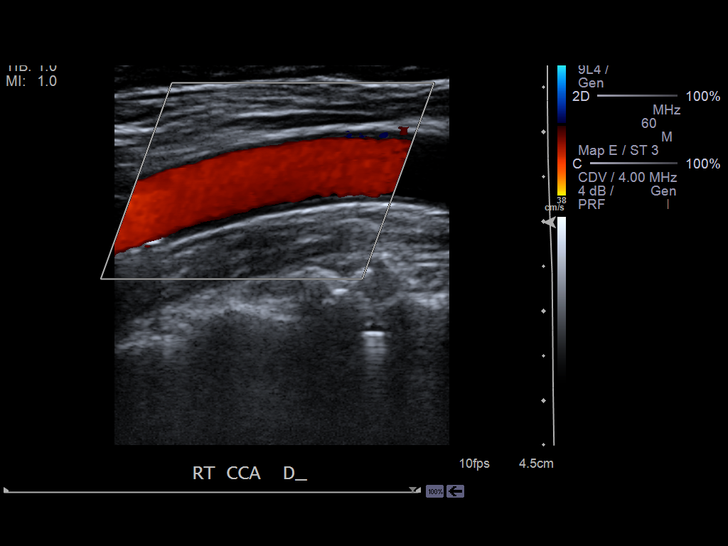
[im 16/89]
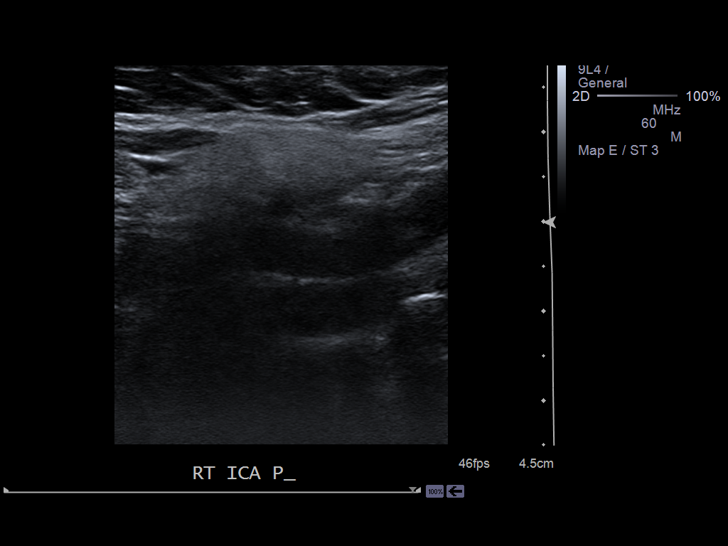
[im 23/89]
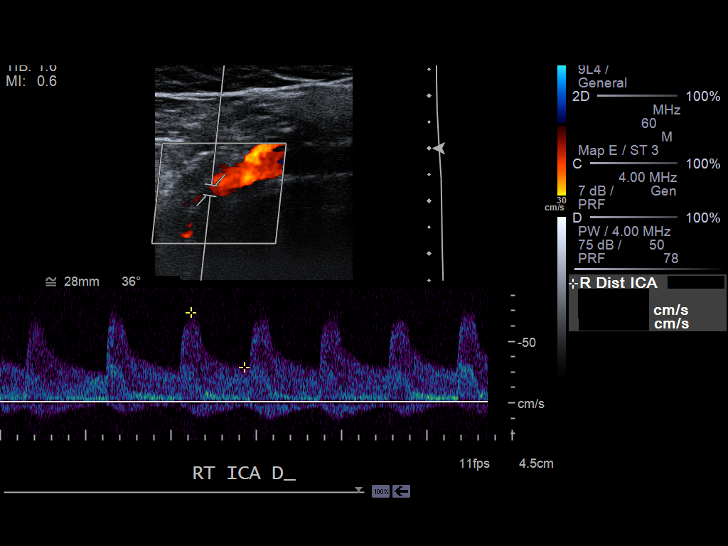
[im 31/89]
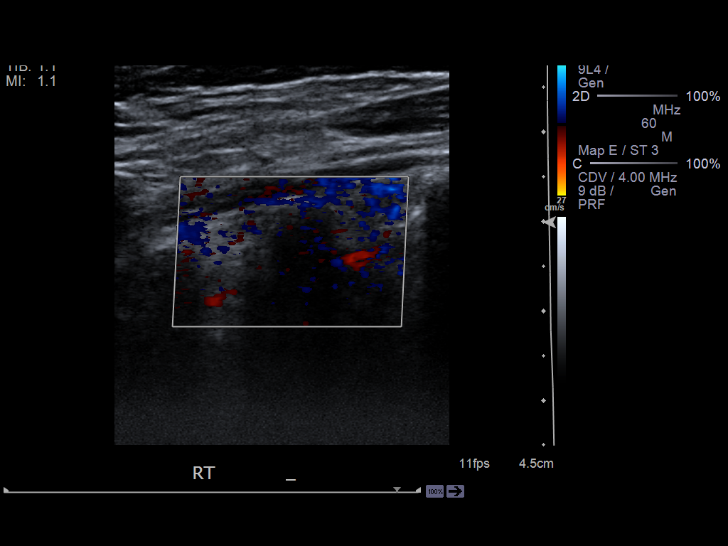
[im 39/89]
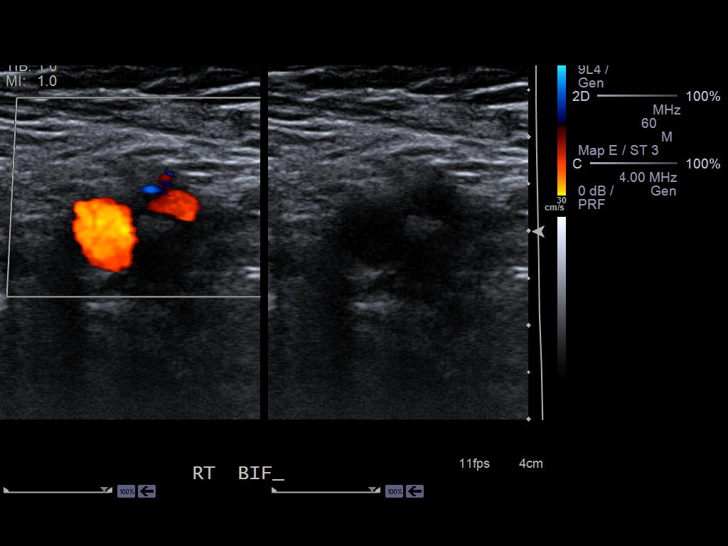
[im 46/89]
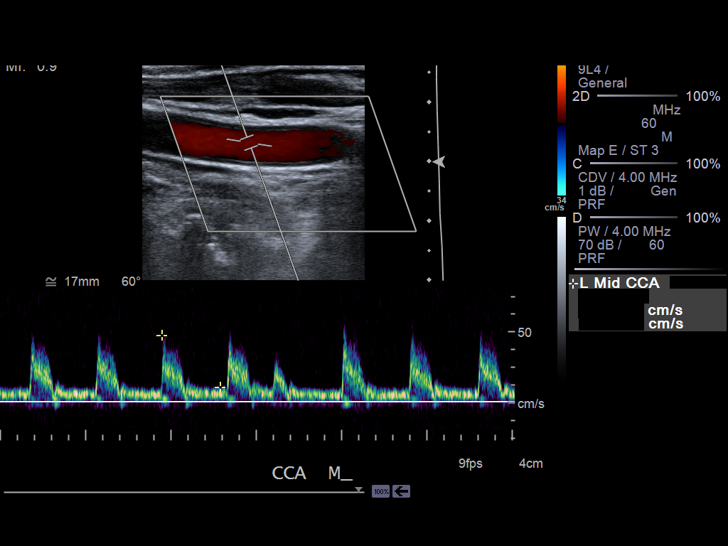
[im 50/89]
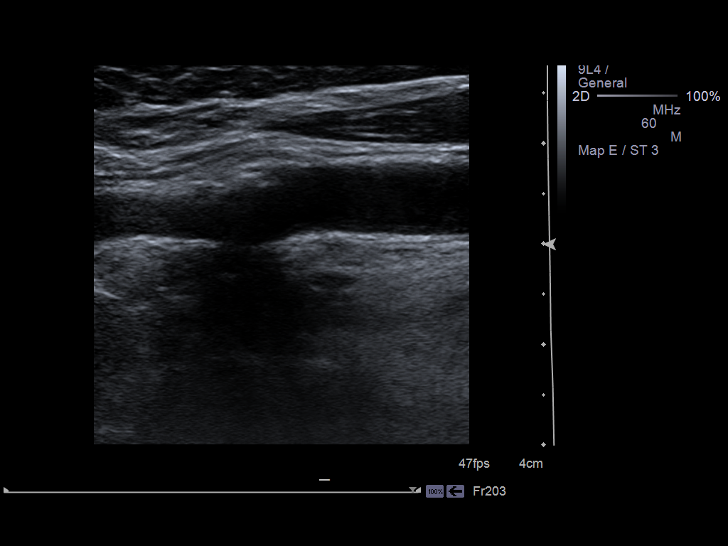
[im 58/89]
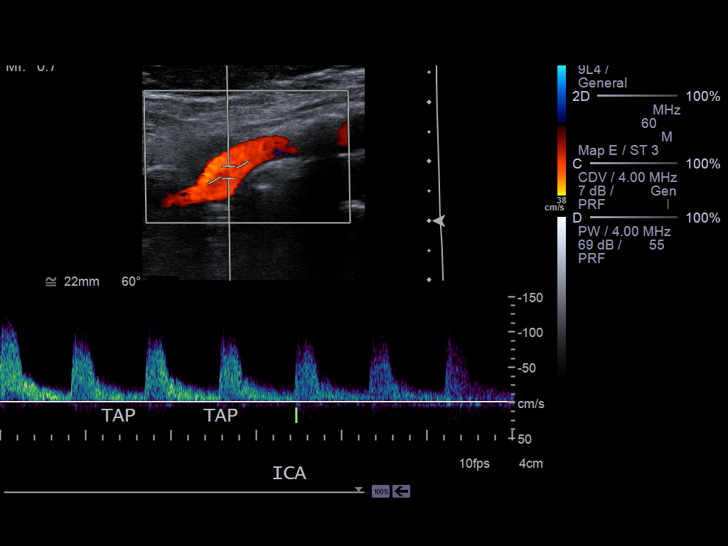
[im 66/89]
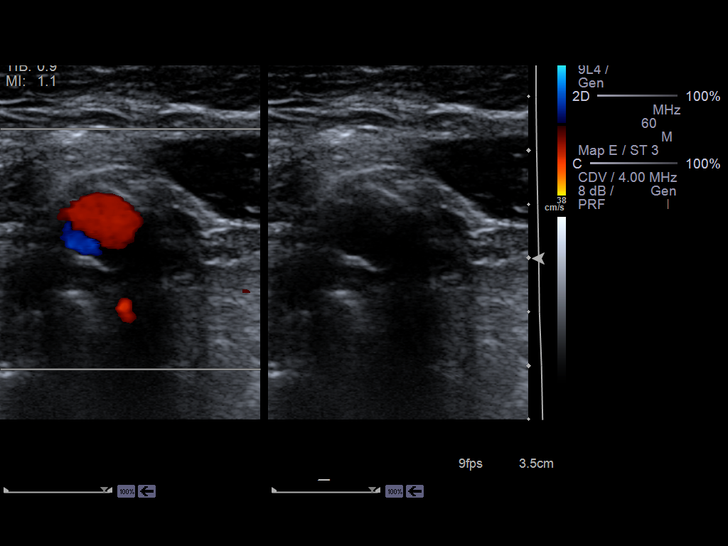
[im 73/89]
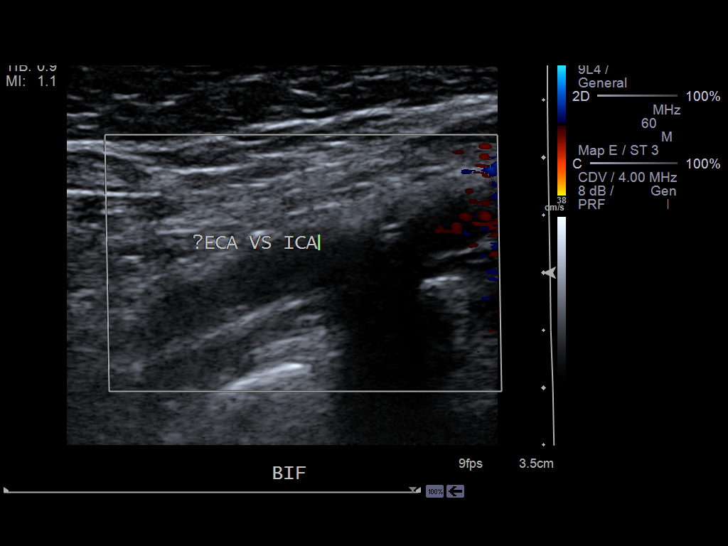
[im 81/89]
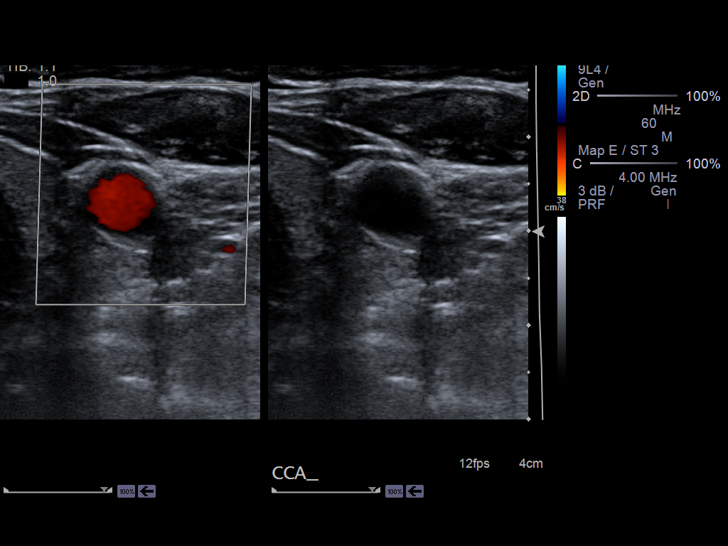
[im 89/89]
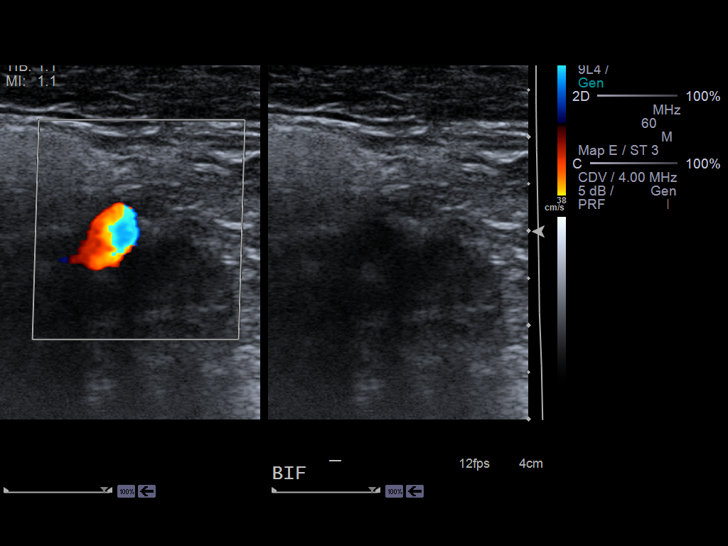

[13 of 24 positions shown; findings below may reference images not displayed]

FINDINGS: Mild atherosclerotic plaque is noted in the right carotid bulb. By visual
inspection, there is less than 50% stenosis. The peak systolic velocities
are not elevated.

Flow is identified within the left common carotid artery. There appears to
be flow in the region of the carotid bulb. Distal to the carotid bulb there
is an occluded vessel. The vessel with flow demonstrates relatively low
resistance waveforms. It does not appear to respond to the temporal tap as
would be expected with the external carotid artery. However, given the
positioning of the vessels and size of the occluded vessel, the findings are
concerning for occlusion of the left internal carotid artery.

The vertebral arteries are patent bilaterally.
IMPRESSION: 1. Findings concerning for occlusion of the left internal carotid artery.
Further evaluation with CTA of the neck is suggested.
2. No evidence of hemodynamically significant stenosis in the extracranial
right carotid artery.

The prior report was called to the ordering clinician's office immediately
after the examination.

[REDACTED]
